# Patient Record
Sex: Female | Born: 1985 | Race: White | Hispanic: No | Marital: Married | Smoking: Former smoker
Health system: Southern US, Community
[De-identification: ages and names within clinical notes are randomized; demographics above are authoritative.]

## PROBLEM LIST (undated history)

## (undated) DIAGNOSIS — Z789 Other specified health status: Secondary | ICD-10-CM

## (undated) HISTORY — DX: Other specified health status: Z78.9

## (undated) HISTORY — PX: PILONIDAL CYST EXCISION: SHX744

---
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
## 2004-05-15 ENCOUNTER — Ambulatory Visit: Payer: Self-pay | Admitting: Family Medicine

## 2004-10-03 ENCOUNTER — Ambulatory Visit: Payer: Self-pay | Admitting: Family Medicine

## 2004-10-03 ENCOUNTER — Other Ambulatory Visit: Admission: RE | Admit: 2004-10-03 | Discharge: 2004-10-03 | Payer: Self-pay | Admitting: Family Medicine

## 2004-12-23 ENCOUNTER — Ambulatory Visit: Payer: Self-pay | Admitting: Family Medicine

## 2005-01-15 ENCOUNTER — Ambulatory Visit: Payer: Self-pay | Admitting: Family Medicine

## 2005-12-21 ENCOUNTER — Ambulatory Visit: Payer: Self-pay | Admitting: Family Medicine

## 2005-12-21 ENCOUNTER — Other Ambulatory Visit: Admission: RE | Admit: 2005-12-21 | Discharge: 2005-12-21 | Payer: Self-pay | Admitting: Family Medicine

## 2005-12-21 ENCOUNTER — Encounter: Payer: Self-pay | Admitting: Family Medicine

## 2006-03-19 ENCOUNTER — Ambulatory Visit: Payer: Self-pay | Admitting: Family Medicine

## 2006-03-26 ENCOUNTER — Ambulatory Visit: Payer: Self-pay | Admitting: Family Medicine

## 2006-06-28 ENCOUNTER — Ambulatory Visit: Payer: Self-pay | Admitting: Family Medicine

## 2006-06-29 ENCOUNTER — Encounter: Payer: Self-pay | Admitting: Family Medicine

## 2006-08-02 ENCOUNTER — Encounter (INDEPENDENT_AMBULATORY_CARE_PROVIDER_SITE_OTHER): Payer: Self-pay | Admitting: Specialist

## 2006-08-02 ENCOUNTER — Ambulatory Visit (HOSPITAL_BASED_OUTPATIENT_CLINIC_OR_DEPARTMENT_OTHER): Admission: RE | Admit: 2006-08-02 | Discharge: 2006-08-02 | Payer: Self-pay | Admitting: Obstetrics and Gynecology

## 2006-11-10 ENCOUNTER — Encounter: Payer: Self-pay | Admitting: Family Medicine

## 2006-11-10 DIAGNOSIS — J309 Allergic rhinitis, unspecified: Secondary | ICD-10-CM | POA: Insufficient documentation

## 2006-11-10 DIAGNOSIS — B009 Herpesviral infection, unspecified: Secondary | ICD-10-CM | POA: Insufficient documentation

## 2006-11-10 DIAGNOSIS — B977 Papillomavirus as the cause of diseases classified elsewhere: Secondary | ICD-10-CM | POA: Insufficient documentation

## 2006-11-22 ENCOUNTER — Encounter: Payer: Self-pay | Admitting: Family Medicine

## 2006-11-22 ENCOUNTER — Other Ambulatory Visit: Admission: RE | Admit: 2006-11-22 | Discharge: 2006-11-22 | Payer: Self-pay | Admitting: Family Medicine

## 2006-11-22 ENCOUNTER — Ambulatory Visit: Payer: Self-pay | Admitting: Family Medicine

## 2006-11-24 ENCOUNTER — Encounter (INDEPENDENT_AMBULATORY_CARE_PROVIDER_SITE_OTHER): Payer: Self-pay | Admitting: *Deleted

## 2006-11-25 ENCOUNTER — Telehealth (INDEPENDENT_AMBULATORY_CARE_PROVIDER_SITE_OTHER): Payer: Self-pay | Admitting: *Deleted

## 2007-11-08 ENCOUNTER — Ambulatory Visit: Payer: Self-pay | Admitting: Family Medicine

## 2007-12-05 ENCOUNTER — Encounter: Payer: Self-pay | Admitting: Family Medicine

## 2007-12-05 ENCOUNTER — Ambulatory Visit: Payer: Self-pay | Admitting: Family Medicine

## 2007-12-05 ENCOUNTER — Other Ambulatory Visit: Admission: RE | Admit: 2007-12-05 | Discharge: 2007-12-05 | Payer: Self-pay | Admitting: Family Medicine

## 2008-04-18 ENCOUNTER — Ambulatory Visit: Payer: Self-pay | Admitting: Family Medicine

## 2008-12-10 ENCOUNTER — Ambulatory Visit: Payer: Self-pay | Admitting: Family Medicine

## 2008-12-10 ENCOUNTER — Encounter: Payer: Self-pay | Admitting: Family Medicine

## 2008-12-10 ENCOUNTER — Other Ambulatory Visit: Admission: RE | Admit: 2008-12-10 | Discharge: 2008-12-10 | Payer: Self-pay | Admitting: Family Medicine

## 2008-12-17 ENCOUNTER — Encounter (INDEPENDENT_AMBULATORY_CARE_PROVIDER_SITE_OTHER): Payer: Self-pay | Admitting: *Deleted

## 2009-06-10 ENCOUNTER — Telehealth: Payer: Self-pay | Admitting: Family Medicine

## 2009-06-12 ENCOUNTER — Ambulatory Visit: Payer: Self-pay | Admitting: Family Medicine

## 2009-06-14 LAB — CONVERTED CEMR LAB
HCV Ab: NEGATIVE
Hep A IgM: NEGATIVE

## 2009-09-12 ENCOUNTER — Ambulatory Visit: Payer: Self-pay | Admitting: Family Medicine

## 2009-09-12 DIAGNOSIS — F39 Unspecified mood [affective] disorder: Secondary | ICD-10-CM | POA: Insufficient documentation

## 2009-11-12 ENCOUNTER — Ambulatory Visit: Payer: Self-pay | Admitting: Family Medicine

## 2009-12-09 ENCOUNTER — Telehealth: Payer: Self-pay | Admitting: Family Medicine

## 2009-12-16 ENCOUNTER — Ambulatory Visit: Payer: Self-pay | Admitting: Family Medicine

## 2009-12-16 ENCOUNTER — Other Ambulatory Visit: Admission: RE | Admit: 2009-12-16 | Discharge: 2009-12-16 | Payer: Self-pay | Admitting: Family Medicine

## 2009-12-16 LAB — CONVERTED CEMR LAB: Pap Smear: NORMAL

## 2009-12-17 LAB — CONVERTED CEMR LAB
Basophils Relative: 0.3 % (ref 0.0–3.0)
Cholesterol: 139 mg/dL (ref 0–200)
Eosinophils Absolute: 0.1 10*3/uL (ref 0.0–0.7)
Eosinophils Relative: 1.3 % (ref 0.0–5.0)
Hemoglobin: 12.8 g/dL (ref 12.0–15.0)
MCHC: 34.4 g/dL (ref 30.0–36.0)
MCV: 89.4 fL (ref 78.0–100.0)
Monocytes Absolute: 0.6 10*3/uL (ref 0.1–1.0)
Neutro Abs: 3.5 10*3/uL (ref 1.4–7.7)
RBC: 4.16 M/uL (ref 3.87–5.11)
Triglycerides: 106 mg/dL (ref 0.0–149.0)
WBC: 5.6 10*3/uL (ref 4.5–10.5)

## 2009-12-18 LAB — CONVERTED CEMR LAB: Hep A IgM: NEGATIVE

## 2009-12-20 ENCOUNTER — Encounter: Payer: Self-pay | Admitting: Family Medicine

## 2009-12-20 LAB — CONVERTED CEMR LAB: Pap Smear: NEGATIVE

## 2010-03-18 ENCOUNTER — Ambulatory Visit: Payer: Self-pay | Admitting: Family Medicine

## 2010-06-17 NOTE — Progress Notes (Signed)
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 She says she and her co-worker cleaned the wound and her dentist employer told her to call her primary doctor to find out what she needs to do.  I told her every medical and dentist office should have their protocol on what to do- that normally she and the patient would need to be tested and she will need follow up testing.  I told her to speak with the dentist about this, but that it should be written somewhere in that office. Initial call taken by: Lowella Petties CMA,  June 10, 2009 10:07 AM  Follow-up for Phone Call        she should follow the protocol that her office has set -- she can schedule appt when she finds out what she needs in terms of lab work  Follow-up by: Judith Part MD,  June 10, 2009 10:28 AM

## 2010-06-17 NOTE — Assessment & Plan Note (Signed)
Summary: got stuck with used needle at work needs to come in for blood...   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  accidentally got stuck with a needle  pt was not high risk for disease   no particular protocol per her office for testing   feels fine- but very anxious about what happened   no fever/abd pain/ fatigue or malaise    Allergies: No Known Drug Allergies  Past History:  Past Medical History: Last updated: 12/05/2007 Allergic rhinitis HPV past   Past Surgical History: Last updated: 11/10/2006 Pilonidal cyst- excised 07/2006  Family History: Last updated: 11/22/2006 P GM with breast cancer  Social History: Last updated: 12/05/2007 works at a Education officer, 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 Eyes:  Denies eye irritation. CV:  Denies chest pain or discomfort and palpitations. Resp:  Denies cough and wheezing. GI:  Denies abdominal pain, nausea, and vomiting. MS:  Denies joint pain. Derm:  Denies itching, poor wound healing, and rash. Neuro:  Denies headaches. Psych:  Complains of anxiety. Heme:  Denies abnormal bruising and bleeding.  Physical Exam  General:  Well-developed,well-nourished,in no acute distress; alert,appropriate and cooperative throughout examination Head:  normocephalic, atraumatic, and no abnormalities observed.   Mouth:  pharynx pink and moist.   Neck:  No deformities, masses, or tenderness noted. Heart:   Normal rate and regular rhythm. S1 and S2 normal without gallop, murmur, click, rub or other extra sounds. Abdomen:  soft and non-tender.   Msk:  no acute joint changes  Skin:  no rash  healed tiny puncture wound consistent with needle stick on dorsal R hand -- no redness or swelling Psych:  pt is distressed and tearful today- but easily consolible    Impression & Recommendations:  Problem # 1:  ACCIDENT CAUSED BY HYPODERMIC NEEDLE (ICD-E920.5) Assessment New accidental needle stick by used needle (low risk pt ) in dental office  labs today incl HIV and hepatitis acute panel  update if any symptoms -- keep wound clean repeat lab in 6 mo if neg  adv that dental office needs to develop protocol for these type of instances  Orders: Venipuncture (11914) T-HIV Antibody  (Reflex) ([id number]) T-Hepatitis Acute Panel ([id number])  Problem # 2:  COMMUNICABLE DISEASE, EXPOSURE TO (ICD-V01.9) Assessment: New see above with needle stick  Orders: Venipuncture (95284) T-HIV Antibody  (Reflex) ([id number]) T-Hepatitis Acute Panel (25366-44034) Specimen Handling (74259)  Complete Medication List: 1)  Ortho Tri-cyclen (28) 0.035 Mg Tabs (Norgestimate-ethinyl estradiol) .... Take by mouth as directed qd  Patient Instructions: 1)  keep wound clean and dry -- if redness or swelling or fever (or other symptoms ) please let me know 2)  labs today 3)  sched lab appt 6 mo for hepatitis panel and HIV -- for 920.5 and V01.9  Prior Medications (reviewed today): ORTHO TRI-CYCLEN (28) 0.035 MG  TABS (NORGESTIMATE-ETHINYL ESTRADIOL) take by mouth as directed qd Current Allergies: No known allergies

## 2010-06-17 NOTE — Assessment & Plan Note (Signed)
Summary: 2ND GARDISIL SHOT / LFW   Nurse Visit   Allergies: No Known Drug Allergies  Immunizations Administered:  HPV # 2:    Vaccine Type: Gardasil    Site: right deltoid    Mfr: Merck    Dose: 0.5 ml    Route: IM    Given by: Delilah Shan CMA (AAMA)    Exp. Date: 04/09/2011    Lot #: [id number]    VIS given: 06/19/05 version given November 12, 2009.  Orders Added: 1)  HPV Vaccine - 3 sched doses - IM [90649] 2)  Admin 1st Vaccine [60454]

## 2010-06-17 NOTE — Assessment & Plan Note (Signed)
Summary: GARDASIL#3/TOWER/CLE   Nurse Visit   Allergies: No Known Drug Allergies  Immunizations Administered:  HPV # 3:    Vaccine Type: Gardasil    Site: left deltoid    Mfr: Merck    Dose: 0.5 ml    Route: IM    Given by: Lewanda Rife LPN    Exp. Date: 03/28/2012    Lot #: [id number]    VIS given: 09/17/09 version given March 18, 2010.  Orders Added: 1)  HPV Vaccine - 3 sched doses - IM [90649] 2)  Admin 1st Vaccine [24268]

## 2010-06-17 NOTE — Assessment & Plan Note (Signed)
Summary: TALK ABOUT CHANGING BIRTH CONTROL & GARDISIL SHOT / LFW   Vital Signs:  Patient profile:   25 year old female Height:      66 inches Weight:      149.50 pounds BMI:     24.22 Temp:     97.7 degrees F oral Pulse rate:   84 / minute Pulse rhythm:   regular BP sitting:   110 / 70  (left arm) Cuff size:   regular  Vitals Entered By: Lewanda Rife LPN (September 12, 2009 10:32 AM) CC: Wants to discuss changing birth control pill and to discuss Gardisil   History of Present Illness: is currently try ortho tri cyclen  is changing as she gets older  stays more tired and some anxiety --- every few mo - worse around menses  her libido is less in general   memory seems to be problematic when she is pre- occupied   looked online -- and rev symptoms of depression- does not have symptoms of depression   work is stressful -- had the incident with needle stick that really scared her  wants to look for another job   periods are just fine -- regular , not too long or painful or heavy   is interested in starting gardasil vaccine series as well  has been with one boyfriend for 2 years -- does use condoms    Allergies (verified): No Known Drug Allergies  Past History:  Past Surgical History: Last updated: 11/10/2006 Pilonidal cyst- excised 07/2006  Family History: Last updated: 11/22/2006 P GM with breast cancer  Social History: Last updated: 12/05/2007 works at a Education officer, 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 Eyes:  Denies blurring and eye pain. CV:  Denies chest pain or discomfort, lightheadness, and palpitations. Resp:  Denies cough and wheezing. GI:  Denies abdominal pain and indigestion. GU:  Denies abnormal vaginal  bleeding, discharge, dysuria, and urinary frequency. MS:  Denies joint pain. Derm:  Denies lesion(s), poor wound healing, and rash. Neuro:  Denies headaches, numbness, and tingling. Psych:  Complains of anxiety and irritability; denies depression, panic attacks, sense of great danger, and suicidal thoughts/plans. Endo:  Denies cold intolerance, excessive thirst, excessive urination, and polyuria. Heme:  Denies abnormal bruising and bleeding.  Physical Exam  General:  Well-developed,well-nourished,in no acute distress; alert,appropriate and cooperative throughout examination Head:  normocephalic, atraumatic, and no abnormalities observed.   Eyes:  vision grossly intact, pupils equal, pupils round, and pupils reactive to light.  no conjunctival pallor, injection or icterus  Mouth:  pharynx pink and moist.   Neck:  supple with full rom and no masses or thyromegally, no JVD or carotid bruit  Chest Wall:  No deformities, masses, or tenderness noted. Lungs:  Normal respiratory effort, chest expands symmetrically. Lungs are clear to auscultation, no crackles or wheezes. Heart:  Normal rate and regular rhythm. S1 and S2 normal without gallop, murmur, click, rub or other extra sounds. Abdomen:  Bowel sounds positive,abdomen soft and non-tender without masses, organomegaly or hernias noted. no suprapubic tenderness or fullness felt  Msk:  No deformity or scoliosis noted of thoracic or lumbar spine.   Extremities:  No clubbing, cyanosis, edema, or deformity noted with normal full range of motion of all joints.  Neurologic:  cranial nerves II-XII intact, sensation intact to light touch, gait normal, and DTRs symmetrical and normal.   Skin:  Intact without suspicious lesions or rashes Cervical Nodes:  No lymphadenopathy noted Inguinal Nodes:  No significant adenopathy Psych:  tearful at times when disc mood changes  no SI  talks candidly about stress with good insignt   Impression &  Recommendations:  Problem # 1:  MOOD SWINGS (ICD-296.99) Assessment New pt has tracked this and thinks may be due to her OC - also dec libido  does not think she has depression will change to monophasic OC - generic yasmin -- and update  if not imp -- adv to f/u and disc further pt is non smoker/ no hx of blood clots  will continue using condoms for std prev  Problem # 2:  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 Take by mouth as directed once daily  generic please  Other Orders: HPV Vaccine - 3 sched doses - IM (60454) Admin 1st Vaccine (09811)  Patient Instructions: 1)  gardasil first shot today  2)  change pills to generic yasmin -- I sent px to pharmacy -- start on sunday 3)  update me in 1-2 months (follow up ) if not feeling better  Prescriptions: YASMIN 28 3-0.03 MG TABS (DROSPIRENONE-ETHINYL ESTRADIOL) take by mouth as directed once daily  generic please  #1 pack x 5   Entered and Authorized by:   Marne Ann Tower MD   Signed by:   Marne Ann Tower MD on 09/12/2009   Method used:   [facility]  [address] [id number]       [address]       [place], [place]  [address]       Ph: [phone] or [phone]       Fax: [phone]   RxID:   [id number]   Current Allergies (reviewed today): No known allergies    Immunizations Administered:  HPV # 1:    Vaccine Type: Gardasil    Site: left deltoid    Mfr: Merck    Dose: 0.5 ml    Route: IM    Given by: Lewanda Rife LPN    Exp. Date: 04/09/2011    Lot #: [id number]    VIS given: 06/19/05 version given September 12, 2009.

## 2010-06-17 NOTE — Progress Notes (Signed)
Summary: refill on orhto try cyclen   Phone Note Call from Patient Call back at Home Phone [phone]   Caller: Patient Call For: Rita Salas Summary of Call: Patient is asking for a refill on orhto tri cyclen. We have yaz on her med list but she says that she never started it, she continued taking the ortho. She has an appt on 12-16-09. [facility] [address]  Initial call taken by: Melody Comas,  December 09, 2009 11:49 AM  Follow-up for Phone Call        px written on EMR for call in  Follow-up by: Rita Salas,  December 09, 2009 1:29 PM    New/Updated Medications: ORTHO TRI-CYCLEN (28) 0.18/0.215/0.25 MG-35 MCG TABS (NORGESTIM-ETH ESTRAD TRIPHASIC) 1 by mouth once daily as directed Prescriptions: ORTHO TRI-CYCLEN (28) 0.18/0.215/0.25 MG-35 MCG TABS (NORGESTIM-ETH ESTRAD TRIPHASIC) 1 by mouth once daily as directed  #1 pack x 1   Entered by:   Melody Comas   Authorized by:   Rita Salas   Signed by:   Melody Comas on 12/09/2009   Method used:   [facility] to        [facility] [phone][facility] (retail)       [address] [place], [place]  [address]       Ph: [phone] or [phone]       Fax: [phone]   RxID:   [id number] ORTHO TRI-CYCLEN (28) 0.18/0.215/0.25 MG-35 MCG TABS (NORGESTIM-ETH ESTRAD TRIPHASIC) 1 by mouth once daily as directed  #1 pack x 1   Entered and Authorized by:   Rita Salas   Signed by:   Melody Comas on 12/09/2009   Method used:   Telephoned to ...       [facility] [phone]* (retail)       [address] [place], [place]  [address]       Ph: [phone] or [phone]       Fax: [phone]   RxID:   [id number]

## 2010-06-17 NOTE — Letter (Signed)
Summary: Results Follow up Letter  [facility]  [address] [place], [place] [address]   Phone: [phone]  Fax: [phone]    12/20/2009 MRN: [id number]  Rita Salas [address] [place], [place]  [address]  Dear Ms. [name],  The following are the results of your recent test(s):  Test         Result    Pap Smear:        Normal __X___  Not Normal _____ Comments: ______________________________________________________ Cholesterol: LDL(Bad cholesterol):         Your goal is less than:         HDL (Good cholesterol):       Your goal is more than: Comments:  ______________________________________________________ Mammogram:        Normal _____  Not Normal _____ Comments:  ___________________________________________________________________ Hemoccult:        Normal _____  Not normal _______ Comments:    _____________________________________________________________________ Other Tests:    We routinely do not discuss normal results over the telephone.  If you desire a copy of the results, or you have any questions about this information we can discuss them at your next office visit.   Sincerely,      Roxy Manns, MD

## 2010-06-17 NOTE — Assessment & Plan Note (Signed)
Summary: PAP SMEAR AND CPX/CLE   Vital Signs:  Patient profile:   25 year old female Height:      66 inches Weight:      148.50 pounds BMI:     24.06 Temp:     97.9 degrees F oral Pulse rate:   84 / minute Pulse rhythm:   regular BP sitting:   110 / 76  (left arm) Cuff size:   regular  Vitals Entered By: Lewanda Rife LPN (December 16, 2009 10:37 AM) CC: CPX with pap and breast exam LMP 12/03/09   History of Present Illness: here for wellness exam and gyn care   has been ok overall   wt is down 1 lb  bp good 110/76  hx of hpv in past last pap nl -- is due for that  had 2 of 3 hpv shots so far-- have gone ok   for hepatitis and hiv tests today 6 mo after needle stick  Tdap 09  OC--ended up going back to ortho tri cyclen -- too expensive with yaz and also worried about side eff  periods are fine - regular and not too heavy or painful   is engaged to be married -- a year from sept -- is starting to plan      Allergies (verified): No Known Drug Allergies  Past History:  Past Medical History: Last updated: 09/12/2009 Allergic rhinitis HPV past  anxiety with mood swings   Past Surgical History: Last updated: 11/10/2006 Pilonidal cyst- excised 07/2006  Family History: Last updated: 11/22/2006 P GM with breast cancer  Social History: Last updated: 12/05/2007 works at a Education officer, 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 Eyes:  Denies blurring and eye irritation. CV:  Denies chest pain or discomfort and lightheadness. Resp:  Denies cough, shortness of breath, and wheezing. GI:  Denies abdominal pain, change in bowel habits, and indigestion. GU:  Denies abnormal vaginal bleeding, discharge, dysuria, and urinary frequency. MS:  Denies joint pain, joint redness, and joint swelling. Derm:  Denies itching, lesion(s),  poor wound healing, and rash. Neuro:  Denies numbness and tingling. Psych:  Denies anxiety and depression. Endo:  Denies cold intolerance, excessive thirst, excessive urination, and heat intolerance. Heme:  Denies abnormal bruising and bleeding.  Physical Exam  General:  Well-developed,well-nourished,in no acute distress; alert,appropriate and cooperative throughout examination Head:  normocephalic, atraumatic, and no abnormalities observed.   Eyes:  vision grossly intact, pupils equal, pupils round, and pupils reactive to light.  no conjunctival pallor, injection or icterus  Ears:  R ear normal and L ear normal.   Nose:  no nasal discharge.   Mouth:  pharynx pink and moist.   Neck:  supple with full rom and no masses or thyromegally, no JVD or carotid bruit  Chest Wall:  No deformities, masses, or tenderness noted. Breasts:  No mass, nodules, thickening, tenderness, bulging, retraction, inflamation, nipple discharge or skin changes noted.   Lungs:  Normal respiratory effort, chest expands symmetrically. Lungs are clear to auscultation, no crackles or wheezes. Heart:  Normal rate and regular rhythm. S1 and S2 normal without gallop, murmur, click, rub or other extra sounds. Abdomen:  Bowel sounds positive,abdomen soft and non-tender without masses, organomegaly or hernias noted. Genitalia:  Normal introitus for age, no external lesions, no vaginal discharge, mucosa pink and moist, no vaginal or cervical lesions,  no vaginal atrophy, no friaility or hemorrhage, normal uterus size and position, no adnexal masses or tenderness mucous noted at OS Msk:  No deformity or scoliosis noted of thoracic or lumbar spine.   Pulses:  R and L carotid,radial,femoral,dorsalis pedis and posterior tibial pulses are full and equal bilaterally Extremities:  No clubbing, cyanosis, edema, or deformity noted with normal full range of motion of all joints.   Neurologic:  sensation intact to light touch, gait normal,  and DTRs symmetrical and normal.   Skin:  Intact without suspicious lesions or rashes Cervical Nodes:  No lymphadenopathy noted Axillary Nodes:  No palpable lymphadenopathy Inguinal Nodes:  No significant adenopathy Psych:  normal affect, talkative and pleasant    Impression & Recommendations:  Problem # 1:  HEALTH MAINTENANCE EXAM (ICD-V70.0) Assessment Comment Only  reviewed health habits including diet, exercise and skin cancer prevention reviewed health maintenance list and family history  some wellness labs and lipiod screen today  Orders: Venipuncture ([address]) TLB-Lipid Panel (80061-LIPID) TLB-Glucose, QUANT (82947-GLU) TLB-CBC Platelet - w/Differential (85025-CBCD)  Problem # 2:  GYNECOLOGICAL EXAMINATION, ROUTINE (ICD-V72.31) Assessment: Comment Only annual exam with pap remote hx of hpv  has had first 2 hpv shots   Problem # 3:  ACCIDENT CAUSED BY HYPODERMIC NEEDLE (ICD-E920.5) Assessment: Comment Only with poss exp to comm disease 6 mo f/u hepatitis and hiv tests   Complete Medication List: 1)  Ortho Tri-cyclen (28) 0.18/0.215/0.25 Mg-35 Mcg Tabs (Norgestim-eth estrad triphasic) .Marland Kitchen.. 1 by mouth once daily as directed  Patient Instructions: 1)  make sure that your 3rd hpv shot is scheduled  2)  eat healthy diet and get regular exercise 3)  wear your sunscreen 4)  I sent your med to pharmacy  5)  labs today  Prescriptions: ORTHO TRI-CYCLEN (28) 0.18/0.215/0.25 MG-35 MCG TABS (NORGESTIM-ETH ESTRAD TRIPHASIC) 1 by mouth once daily as directed  #1 pack x 11   Entered and Authorized by:   Judith Part MD   Signed by:   Judith Part MD on 12/16/2009   Method used:   [facility] to        [facility] [phone]* (retail)       [address] [place], [place]  [address]       Ph: [phone] or [phone]       Fax: [phone]   RxID:   [id number]   Current Allergies (reviewed today): No known allergies

## 2010-10-03 NOTE — Op Note (Signed)
NAME:  Rita Salas, Rita Salas             ACCOUNT NO.:  [id number]   MEDICAL RECORD NO.:  [id number]          PATIENT TYPE:  AMB   LOCATION:  [facility]                          FACILITY:  [facility]   PHYSICIAN:  Sandria Bales. Ezzard Standing, M.D.  DATE OF BIRTH:  1986-02-04   DATE OF PROCEDURE:  08/02/2006  DATE OF DISCHARGE:                               OPERATIVE REPORT   PREOPERATIVE DIAGNOSIS:  Recurrent infections and pilonidal cyst.   POSTOPERATIVE DIAGNOSIS:  Recurrent infections and pilonidal cyst.   PROCEDURE:  Excision of pilonidal cyst.   SURGEON:  Sandria Bales. Ezzard Standing, M.D.   FIRST ASSISTANT:  None.   ANESTHESIA:  General endotracheal with approximately 30 mL of 0.25%  Marcaine.   COMPLICATIONS:  None.   REASON FOR PROCEDURE:  Ms. [name] is a 25 year old white female who has  had at least 2 bouts with pilonidal cyst infections.  This is now  healed, but she comes for attempted wide excision of this pilonidal  cyst.   The indications and potential complications were explained to the  patient.  Potential complications include but are not limited to  bleeding, infections, recurrence of the pilonidal cyst.   OPERATIVE NOTE:  The patient underwent general endotracheal anesthetic.  A timeout was performed confirming patient and the planned procedure.  Dr. Autumn Patty supervised anesthesia.  The patient was placed in  the prone position after general endotracheal anesthetic.   Her buttocks were taped apart.  The buttocks was painted with Betadine  solution and sterilely draped.  The patient had a scar to the left of  her intergluteal cleft and she had a scar towards the middle of her  intergluteal cleft.  I enclosed the midline scar with my excision.  I  left the other scare outside, but excised this area the way down to her  coccyx.   There was on evidence of any tract forming to the left.  I infiltrated  with about 30 mL of 0.25% Marcaine.  I controlled bleeding with  electrocautery.  I  then packed the wound open with Betadine gauze and  covered this with 4 x 4's and sterilely dressed it.   The patient tolerated the procedure well.  She will be discharged home  today.  Start dressing changes tomorrow and see me back in approximately  1 week in the office.      Sandria Bales. Ezzard Standing, M.D.  Electronically Signed     DHN/MEDQ  D:  08/02/2006  T:  08/02/2006  Job:  [id number]   cc:   Marne A. Milinda Antis, MD

## 2011-11-16 ENCOUNTER — Encounter: Payer: Self-pay | Admitting: Family Medicine

## 2011-11-16 ENCOUNTER — Other Ambulatory Visit (HOSPITAL_COMMUNITY)
Admission: RE | Admit: 2011-11-16 | Discharge: 2011-11-16 | Disposition: A | Discharge status: Home / Self Care | Payer: BC Managed Care – PPO | Source: Ambulatory Visit | Attending: Family Medicine | Admitting: Family Medicine

## 2011-11-16 ENCOUNTER — Ambulatory Visit (INDEPENDENT_AMBULATORY_CARE_PROVIDER_SITE_OTHER): Payer: BC Managed Care – PPO | Admitting: Family Medicine

## 2011-11-16 VITALS — BP 134/83 | HR 86 | Temp 99.1°F | Ht 66.0 in | Wt 162.8 lb

## 2011-11-16 DIAGNOSIS — Z0000 Encounter for general adult medical examination without abnormal findings: Secondary | ICD-10-CM

## 2011-11-16 DIAGNOSIS — Z01419 Encounter for gynecological examination (general) (routine) without abnormal findings: Secondary | ICD-10-CM

## 2011-11-16 NOTE — Assessment & Plan Note (Signed)
Annual exam with pap  Last pap nl  Remote hx HPV Had some spotting today as well from menses  May try to conceive in future- adv to take PNV

## 2011-11-16 NOTE — Patient Instructions (Addendum)
Labs today Keep up good health habits If you decide to try for pregnancy- get a generic pre natal vitamin otc and take daily

## 2011-11-16 NOTE — Assessment & Plan Note (Signed)
Reviewed health habits including diet and exercise and skin cancer prevention Also reviewed health mt list, fam hx and immunizations  Wellness labs today 

## 2011-11-16 NOTE — Progress Notes (Signed)
Subjective:    Patient ID: Rita Salas, female    DOB: May 26, 1985, 26 y.o.   MRN: [id number]  HPI Here for health maintenance exam and to review chronic medical problems  And gyn care  Is doing ok  Working a lot  Nothing new health wise  Planning wedding / buying a house was stressful   bp 134/83 Wt is up 14 lb with bmi of 26  Due for gyn/ pap exam Last pap nl in 2011 Hx of HPV No gyn problems lately  No contraception  Still using condoms  Not a smoker   Flu shot --did not get one this year   Is eating a healthy diet and getting good exercise   Wants to go ahead and get labs today  Patient Active Problem List  Diagnosis  . HSV  . HPV  . MOOD SWINGS  . ALLERGIC RHINITIS  . Routine gynecological examination  . Routine general medical examination at a health care facility   No past medical history on file. No past surgical history on file. History  Substance Use Topics  . Smoking status: Former Games developer  . Smokeless tobacco: Not on file  . Alcohol Use: Yes     occassionally   No family history on file. No Known Allergies No current outpatient prescriptions on file prior to visit.     Patient Active Problem List  Diagnosis  . HSV  . HPV  . MOOD SWINGS  . ALLERGIC RHINITIS  . Routine gynecological examination  . Routine general medical examination at a health care facility   No past medical history on file. No past surgical history on file. History  Substance Use Topics  . Smoking status: Former Games developer  . Smokeless tobacco: Not on file  . Alcohol Use: Yes     occassionally   No family history on file. No Known Allergies No current outpatient prescriptions on file prior to visit.      Review of Systems Review of Systems  Constitutional: Negative for fever, appetite change, fatigue and unexpected weight change.  Eyes: Negative for pain and visual disturbance.  Respiratory: Negative for cough and shortness of breath.   Cardiovascular:  Negative for cp or palpitations    Gastrointestinal: Negative for nausea, diarrhea and constipation.  Genitourinary: Negative for urgency and frequency.  Skin: Negative for pallor or rash   Neurological: Negative for weakness, light-headedness, numbness and headaches.  Hematological: Negative for adenopathy. Does not bruise/bleed easily.  Psychiatric/Behavioral: Negative for dysphoric mood. The patient is not nervous/anxious.         Objective:   Physical Exam  Constitutional: She appears well-developed and well-nourished. No distress.  HENT:  Head: Normocephalic and atraumatic.  Right Ear: External ear normal.  Left Ear: External ear normal.  Nose: Nose normal.  Mouth/Throat: Oropharynx is clear and moist.  Eyes: Conjunctivae and EOM are normal. Pupils are equal, round, and reactive to light. No scleral icterus.  Neck: Normal range of motion. Neck supple. No JVD present. Carotid bruit is not present. No thyromegaly present.  Cardiovascular: Normal rate, regular rhythm, normal heart sounds and intact distal pulses.  Exam reveals no gallop.   Pulmonary/Chest: Effort normal and breath sounds normal. No respiratory distress. She has no wheezes.  Abdominal: Soft. Bowel sounds are normal. She exhibits no distension, no abdominal bruit and no mass. There is no tenderness.  Genitourinary: Vagina normal and uterus normal. No breast swelling, tenderness, discharge or bleeding. There is no lesion on the right  labia. There is no lesion on the left labia. Uterus is not enlarged and not tender. Cervix exhibits no motion tenderness, no discharge and no friability. Right adnexum displays no mass, no tenderness and no fullness. Left adnexum displays no mass and no tenderness. No vaginal discharge found.       Breast exam: No mass, nodules, thickening, tenderness, bulging, retraction, inflamation, nipple discharge or skin changes noted.  No axillary or clavicular LA.  Chaperoned exam.   Cervix is posterior   Musculoskeletal: Normal range of motion. She exhibits no edema and no tenderness.  Lymphadenopathy:    She has no cervical adenopathy.  Neurological: She is alert. She has normal reflexes. No cranial nerve deficit. She exhibits normal muscle tone. Coordination normal.  Skin: Skin is warm and dry. No rash noted.  Psychiatric: She has a normal mood and affect.          Assessment & Plan:

## 2011-11-17 LAB — CBC WITH DIFFERENTIAL/PLATELET
Basophils Relative: 0.3 % (ref 0.0–3.0)
Eosinophils Absolute: 0.1 10*3/uL (ref 0.0–0.7)
Eosinophils Relative: 1.2 % (ref 0.0–5.0)
HCT: 38.3 % (ref 36.0–46.0)
Lymphs Abs: 1.5 10*3/uL (ref 0.7–4.0)
MCHC: 33.2 g/dL (ref 30.0–36.0)
MCV: 90.3 fl (ref 78.0–100.0)
Monocytes Absolute: 0.5 10*3/uL (ref 0.1–1.0)
Neutro Abs: 4.5 10*3/uL (ref 1.4–7.7)
Neutrophils Relative %: 68.1 % (ref 43.0–77.0)
RBC: 4.24 Mil/uL (ref 3.87–5.11)

## 2011-11-17 LAB — LIPID PANEL
Cholesterol: 132 mg/dL (ref 0–200)
HDL: 47.5 mg/dL (ref >39.00)
LDL Cholesterol: 71 mg/dL (ref 0–99)
VLDL: 13.4 mg/dL (ref 0.0–40.0)

## 2011-11-17 LAB — COMPREHENSIVE METABOLIC PANEL
AST: 18 U/L (ref 0–37)
Alkaline Phosphatase: 81 U/L (ref 39–117)
BUN: 10 mg/dL (ref 6–23)
Creatinine, Ser: 0.7 mg/dL (ref 0.4–1.2)
Glucose, Bld: 100 mg/dL — ABNORMAL HIGH (ref 70–99)
Potassium: 3.9 mEq/L (ref 3.5–5.1)
Total Bilirubin: 0.6 mg/dL (ref 0.3–1.2)

## 2011-11-17 LAB — TSH: TSH: 1.73 u[IU]/mL (ref 0.35–5.50)

## 2013-02-14 ENCOUNTER — Encounter: Payer: Self-pay | Admitting: Family Medicine

## 2013-02-14 ENCOUNTER — Other Ambulatory Visit (HOSPITAL_COMMUNITY)
Admission: RE | Admit: 2013-02-14 | Discharge: 2013-02-14 | Disposition: A | Discharge status: Home / Self Care | Payer: BC Managed Care – PPO | Source: Ambulatory Visit | Attending: Family Medicine | Admitting: Family Medicine

## 2013-02-14 ENCOUNTER — Ambulatory Visit (INDEPENDENT_AMBULATORY_CARE_PROVIDER_SITE_OTHER): Payer: BC Managed Care – PPO | Admitting: Family Medicine

## 2013-02-14 VITALS — BP 98/62 | HR 75 | Temp 98.5°F | Ht 66.0 in | Wt 168.5 lb

## 2013-02-14 DIAGNOSIS — Z23 Encounter for immunization: Secondary | ICD-10-CM

## 2013-02-14 DIAGNOSIS — Z01419 Encounter for gynecological examination (general) (routine) without abnormal findings: Secondary | ICD-10-CM | POA: Insufficient documentation

## 2013-02-14 DIAGNOSIS — Z0000 Encounter for general adult medical examination without abnormal findings: Secondary | ICD-10-CM

## 2013-02-14 NOTE — Progress Notes (Signed)
Subjective:    Patient ID: Rita Salas, female    DOB: 08/15/1985, 27 y.o.   MRN: [id number]  HPI Here for health maintenance exam and to review chronic medical problems    Doing well  Working a lot   Flu vaccine - she has never had a flu shot -- will get one this year  Td 7/09 Pap 7/13 - and needs one day  HPV in the past  peroids are fine -not too long or painful or heavy as a rule    (last spring had a few peroids with cramping)  Wt is up 8 lb   Not on OC  Does not need birth control - is married and wants pregnancy to happen naturally  Not on a pnv   Lab work panel in 2013 that was normal Lab Results  Component Value Date   CHOL 132 11/16/2011   HDL 47.50 11/16/2011   LDLCALC 71 11/16/2011   TRIG 67.0 11/16/2011   CHOLHDL 3 11/16/2011      Patient Active Problem List   Diagnosis Date Noted  . Routine gynecological examination 11/16/2011  . Routine general medical examination at a health care facility 11/16/2011  . MOOD SWINGS 09/12/2009  . 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 ALLERGIC RHINITIS 11/10/2006   No past medical history on file. No past surgical history on file. History  Substance Use Topics  . Smoking status: Former Games developer  . Smokeless tobacco: Not on file  . Alcohol Use: Yes     Comment: occassionally   No family history on file. No Known Allergies No current outpatient prescriptions on file prior to visit.   No current facility-administered medications on file prior to visit.    Review of Systems Review of Systems  Constitutional: Negative for fever, appetite change, fatigue and unexpected weight change.  Eyes: Negative for pain and visual disturbance.  Respiratory: Negative for cough and shortness of breath.   Cardiovascular: Negative for cp or palpitations    Gastrointestinal: Negative for nausea, diarrhea and constipation.  Genitourinary: Negative for urgency and frequency.  Skin: Negative for pallor or rash   Neurological: Negative for  weakness, light-headedness, numbness and headaches.  Hematological: Negative for adenopathy. Does not bruise/bleed easily.  Psychiatric/Behavioral: Negative for dysphoric mood. The patient is not nervous/anxious.         Objective:   Physical Exam  Constitutional: She appears well-developed and well-nourished. No distress.  HENT:  Head: Normocephalic and atraumatic.  Right Ear: External ear normal.  Left Ear: External ear normal.  Nose: Nose normal.  Mouth/Throat: Oropharynx is clear and moist.  Eyes: Conjunctivae and EOM are normal. Pupils are equal, round, and reactive to light. Right eye exhibits no discharge. Left eye exhibits no discharge. No scleral icterus.  Neck: Normal range of motion. Neck supple. No JVD present. Carotid bruit is not present. No thyromegaly present.  Prominent symmetric thyroid -but not enlarged   Cardiovascular: Normal rate, regular rhythm, normal heart sounds and intact distal pulses.  Exam reveals no gallop.   Pulmonary/Chest: Effort normal and breath sounds normal. No respiratory distress. She has no wheezes. She has no rales.  Abdominal: Soft. Bowel sounds are normal. She exhibits no distension, no abdominal bruit and no mass. There is no tenderness.  Genitourinary: Vagina normal and uterus normal. No breast swelling, tenderness, discharge or bleeding. There is no rash, tenderness or lesion on the right labia. There is no rash, tenderness or lesion on the left labia. Uterus is  not enlarged and not tender. Cervix exhibits discharge. Cervix exhibits no motion tenderness and no friability. Right adnexum displays no mass, no tenderness and no fullness. Left adnexum displays no mass, no tenderness and no fullness. No bleeding around the vagina. No vaginal discharge found.  Clear cervical d/c  Breast exam: No mass, nodules, thickening, tenderness, bulging, retraction, inflamation, nipple discharge or skin changes noted.  No axillary or clavicular LA.  Chaperoned  exam.    Musculoskeletal: She exhibits no edema and no tenderness.  Lymphadenopathy:    She has no cervical adenopathy.  Neurological: She is alert. She has normal reflexes. No cranial nerve deficit. She exhibits normal muscle tone. Coordination normal.  Skin: Skin is warm and dry. No rash noted. No erythema. No pallor.  Psychiatric: She has a normal mood and affect.          Assessment & Plan:

## 2013-02-14 NOTE — Patient Instructions (Addendum)
Flu vaccine today Pap smear today  Be sure to get a prenatal vitamin over the counter and take one daily as long as you do not use birth control  Keep exercising and take care of yourself

## 2013-02-14 NOTE — Assessment & Plan Note (Signed)
Reviewed health habits including diet and exercise and skin cancer prevention Also reviewed health mt list, fam hx and immunizations   Flu vaccine today

## 2013-02-14 NOTE — Assessment & Plan Note (Signed)
Annual exam / pap in pt with remote hx of HPV    (then had vaccine) Nl pap 1 y ago Rev pre conception counseling - and need to take PNV daily

## 2014-01-09 ENCOUNTER — Ambulatory Visit (INDEPENDENT_AMBULATORY_CARE_PROVIDER_SITE_OTHER): Payer: BC Managed Care – PPO | Admitting: Family Medicine

## 2014-01-09 ENCOUNTER — Encounter: Payer: Self-pay | Admitting: Family Medicine

## 2014-01-09 VITALS — BP 104/72 | HR 69 | Temp 98.1°F | Ht 66.0 in | Wt 164.2 lb

## 2014-01-09 DIAGNOSIS — K644 Residual hemorrhoidal skin tags: Secondary | ICD-10-CM | POA: Insufficient documentation

## 2014-01-09 NOTE — Assessment & Plan Note (Signed)
Tiny anal skin tag (or poss very small ext hemorrhoid) at 6:00 It has itched  Recommend TUKS pads to clean after BMs  Update if this becomes symptomatic/grows

## 2014-01-09 NOTE — Progress Notes (Signed)
Pre visit review using our clinic review tool, if applicable. No additional management support is needed unless otherwise documented below in the visit note. 

## 2014-01-09 NOTE — Patient Instructions (Signed)
I think you have an anal skin tag / or less likely an external hemorrhoid  If it bothers you try TUKS medicated pads over the counter - to clean after bowel movements Let me know if it becomes bothersome

## 2014-01-09 NOTE — Progress Notes (Signed)
Subjective:    Patient ID: Rita Salas, female    DOB: 15-Jun-1985, 28 y.o.   MRN: [id number]  HPI Here with a bump near her rectum Hx of hpv/ genital warts   Had something come up near rectum Does not hurt/ just itches  Thinks she noticed it in July- it more prominent now   No blood in stool   No active warts in vaginal area - no problems since it first happened (8-9 years)   With one sexual partner right now (husband)   Has been a little itchy -all over / 2 weeks since she went to [place]  No rash however   Patient Active Problem List   Diagnosis Date Noted  . Routine gynecological examination 11/16/2011  . Routine general medical examination at a health care facility 11/16/2011  . MOOD SWINGS 09/12/2009  . 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 ALLERGIC RHINITIS 11/10/2006   No past medical history on file. No past surgical history on file. History  Substance Use Topics  . Smoking status: Former Games developer  . Smokeless tobacco: Not on file  . Alcohol Use: Yes     Comment: occassionally   No family history on file. No Known Allergies No current outpatient prescriptions on file prior to visit.   No current facility-administered medications on file prior to visit.    Review of Systems Review of Systems  Constitutional: Negative for fever, appetite change, fatigue and unexpected weight change.  Eyes: Negative for pain and visual disturbance.  Respiratory: Negative for cough and shortness of breath.   Cardiovascular: Negative for cp or palpitations    Gastrointestinal: Negative for nausea, diarrhea and constipation. neg for blood in stool  Genitourinary: Negative for urgency and frequency. neg for vag d/c or lesions  Skin: Negative for pallor or rash   Neurological: Negative for weakness, light-headedness, numbness and headaches.  Hematological: Negative for adenopathy. Does not bruise/bleed easily.  Psychiatric/Behavioral: Negative for dysphoric mood. The patient  is not nervous/anxious.         Objective:   Physical Exam  Constitutional: She appears well-developed and well-nourished. No distress.  HENT:  Head: Normocephalic and atraumatic.  Eyes: Conjunctivae and EOM are normal. Pupils are equal, round, and reactive to light.  Neck: Normal range of motion. Neck supple.  Cardiovascular: Normal rate, regular rhythm and normal heart sounds.   Pulmonary/Chest: Effort normal and breath sounds normal.  Abdominal: Soft. Bowel sounds are normal. She exhibits no distension and no mass. There is no tenderness. There is no rebound and no guarding.  No suprapubic tenderness or fullness    Genitourinary: Rectum normal. Rectal exam shows no mass, no tenderness and anal tone normal.  Small anal skin tag or poss external hemorrhoid non thrombosed at 6:00 nontender Nl app rectum  No genital warts seen   Musculoskeletal: She exhibits no edema.  Lymphadenopathy:    She has no cervical adenopathy.  Neurological: She is alert.  Skin: Skin is warm and dry. No rash noted. No erythema. No pallor.  Psychiatric: She has a normal mood and affect.          Assessment & Plan:   Problem List Items Addressed This Visit     Digestive   Anal skin tag - Primary     Tiny anal skin tag (or poss very small ext hemorrhoid) at 6:00 It has itched  Recommend TUKS pads to clean after BMs  Update if this becomes symptomatic/grows

## 2014-02-19 ENCOUNTER — Ambulatory Visit (INDEPENDENT_AMBULATORY_CARE_PROVIDER_SITE_OTHER): Payer: BC Managed Care – PPO | Admitting: Family Medicine

## 2014-02-19 ENCOUNTER — Encounter: Payer: Self-pay | Admitting: Family Medicine

## 2014-02-19 VITALS — BP 102/66 | HR 81 | Temp 98.6°F | Ht 65.5 in | Wt 165.0 lb

## 2014-02-19 DIAGNOSIS — K644 Residual hemorrhoidal skin tags: Secondary | ICD-10-CM

## 2014-02-19 DIAGNOSIS — Z23 Encounter for immunization: Secondary | ICD-10-CM

## 2014-02-19 DIAGNOSIS — E049 Nontoxic goiter, unspecified: Secondary | ICD-10-CM

## 2014-02-19 DIAGNOSIS — Z0000 Encounter for general adult medical examination without abnormal findings: Secondary | ICD-10-CM

## 2014-02-19 LAB — CBC WITH DIFFERENTIAL/PLATELET
Basophils Absolute: 0 10*3/uL (ref 0.0–0.1)
Basophils Relative: 0.3 % (ref 0.0–3.0)
EOS PCT: 1 % (ref 0.0–5.0)
Eosinophils Absolute: 0.1 10*3/uL (ref 0.0–0.7)
HEMATOCRIT: 37 % (ref 36.0–46.0)
HEMOGLOBIN: 12.3 g/dL (ref 12.0–15.0)
LYMPHS ABS: 1.8 10*3/uL (ref 0.7–4.0)
Lymphocytes Relative: 21.2 % (ref 12.0–46.0)
MCHC: 33.2 g/dL (ref 30.0–36.0)
MCV: 89.5 fl (ref 78.0–100.0)
MONOS PCT: 7.8 % (ref 3.0–12.0)
Monocytes Absolute: 0.6 10*3/uL (ref 0.1–1.0)
NEUTROS ABS: 5.8 10*3/uL (ref 1.4–7.7)
Neutrophils Relative %: 69.7 % (ref 43.0–77.0)
Platelets: 238 10*3/uL (ref 150.0–400.0)
RBC: 4.13 Mil/uL (ref 3.87–5.11)
RDW: 12.6 % (ref 11.5–15.5)
WBC: 8.3 10*3/uL (ref 4.0–10.5)

## 2014-02-19 LAB — LIPID PANEL
Cholesterol: 137 mg/dL (ref 0–200)
HDL: 39.7 mg/dL (ref >39.00)
LDL Cholesterol: 72 mg/dL (ref 0–99)
NONHDL: 97.3
TRIGLYCERIDES: 126 mg/dL (ref 0.0–149.0)
Total CHOL/HDL Ratio: 3
VLDL: 25.2 mg/dL (ref 0.0–40.0)

## 2014-02-19 LAB — COMPREHENSIVE METABOLIC PANEL
ALT: 10 U/L (ref 0–35)
AST: 18 U/L (ref 0–37)
Albumin: 4.2 g/dL (ref 3.5–5.2)
Alkaline Phosphatase: 75 U/L (ref 39–117)
BILIRUBIN TOTAL: 0.4 mg/dL (ref 0.2–1.2)
BUN: 11 mg/dL (ref 6–23)
CO2: 25 meq/L (ref 19–32)
CREATININE: 0.8 mg/dL (ref 0.4–1.2)
Calcium: 9.2 mg/dL (ref 8.4–10.5)
Chloride: 105 mEq/L (ref 96–112)
GFR: 89.76 mL/min (ref >60.00)
GLUCOSE: 86 mg/dL (ref 70–99)
Potassium: 3.8 mEq/L (ref 3.5–5.1)
Sodium: 138 mEq/L (ref 135–145)
Total Protein: 8.1 g/dL (ref 6.0–8.3)

## 2014-02-19 LAB — TSH: TSH: 0.8 u[IU]/mL (ref 0.35–4.50)

## 2014-02-19 LAB — T4, FREE: Free T4: 0.75 ng/dL (ref 0.60–1.60)

## 2014-02-19 MED ORDER — HYDROCORTISONE 2.5 % RE CREA: 1.0000 "application " | TOPICAL_CREAM | Freq: Two times a day (BID) | RECTAL | Status: DC

## 2014-02-19 NOTE — Assessment & Plan Note (Signed)
New-on exam today Seems to be symmetric No symptoms  Lab today Thyroid US planned Fu planned

## 2014-02-19 NOTE — Assessment & Plan Note (Signed)
This itches more - suspect ext hemorrhoid  Px anusol hc cream  Check at 2 wk f/u

## 2014-02-19 NOTE — Patient Instructions (Addendum)
Flu vaccine today  Stop at check out for referral for a thyroid ultrasound  Labs today  Use anusol hc for rectal itching and we will re evaluate it at follow up   We will review results at follow up

## 2014-02-19 NOTE — Progress Notes (Signed)
Subjective:    Patient ID: Rita Salas, female    DOB: [birth year], 28 y.o.   MRN: [id number]  HPI Here for health maintenance exam and to review chronic medical problems  Has been feeling fine   Still has problems with itchy skin tag   TUKS pads sometimes work and sometimes do not  Has not tried any steroid creams    Wt is stable  bmi is 27  Flu vaccine - will get that today   Pap 10/14 - cannot do today - period started today , so she made appt in 2 weeks  Self breast exam   Td 7/09   Last labs were in 2013 Will do a wellness panel today  Diet is "the same" - pretty good overall Lab Results  Component Value Date   CHOL 132 11/16/2011   HDL 47.50 11/16/2011   LDLCALC 71 11/16/2011   TRIG 67.0 11/16/2011   CHOLHDL 3 11/16/2011    Walking when the weather allows it for exercise   Patient Active Problem List   Diagnosis Date Noted  . Anal skin tag 01/09/2014  . Encounter for routine gynecological examination 11/16/2011  . Routine general medical examination at a health care facility 11/16/2011  . MOOD SWINGS 09/12/2009  . 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 ALLERGIC RHINITIS 11/10/2006   No past medical history on file. No past surgical history on file. History  Substance Use Topics  . Smoking status: Former Games developer  . Smokeless tobacco: Not on file  . Alcohol Use: Yes     Comment: occassionally   No family history on file. No Known Allergies No current outpatient prescriptions on file prior to visit.   No current facility-administered medications on file prior to visit.      Review of Systems Review of Systems  Constitutional: Negative for fever, appetite change, fatigue and unexpected weight change.  Eyes: Negative for pain and visual disturbance.  Respiratory: Negative for cough and shortness of breath.   Cardiovascular: Negative for cp or palpitations    Gastrointestinal: Negative for nausea, diarrhea and constipation.  Genitourinary: Negative for  urgency and frequency.  Skin: Negative for pallor or rash  pos for rectal itching  Neurological: Negative for weakness, light-headedness, numbness and headaches.  Hematological: Negative for adenopathy. Does not bruise/bleed easily.  Psychiatric/Behavioral: Negative for dysphoric mood. The patient is not nervous/anxious.         Objective:   Physical Exam  Constitutional: She appears well-developed and well-nourished. No distress.  HENT:  Head: Normocephalic and atraumatic.  Right Ear: External ear normal.  Left Ear: External ear normal.  Nose: Nose normal.  Mouth/Throat: Oropharynx is clear and moist.  Eyes: Conjunctivae and EOM are normal. Pupils are equal, round, and reactive to light. Right eye exhibits no discharge. Left eye exhibits no discharge. No scleral icterus.  Neck: Normal range of motion. Neck supple. No JVD present. Thyromegaly present.  Symmetric thyroid enl noted   Cardiovascular: Normal rate, regular rhythm, normal heart sounds and intact distal pulses.  Exam reveals no gallop.   Pulmonary/Chest: Effort normal and breath sounds normal. No respiratory distress. She has no wheezes. She has no rales.  Abdominal: Soft. Bowel sounds are normal. She exhibits no distension and no mass. There is no tenderness.  Musculoskeletal: She exhibits no edema and no tenderness.  Lymphadenopathy:    She has no cervical adenopathy.  Neurological: She is alert. She has normal reflexes. She displays no tremor. No cranial nerve  deficit. She exhibits normal muscle tone. Coordination normal.  Skin: Skin is warm and dry. No rash noted. No erythema. No pallor.  Psychiatric: She has a normal mood and affect.          Assessment & Plan:   Problem List Items Addressed This Visit     Digestive   Anal skin tag     This itches more - suspect ext hemorrhoid  Px anusol hc cream  Check at 2 wk f/u      Endocrine   Enlarged thyroid gland     New-on exam today Seems to be symmetric No  symptoms  Lab today Thyroid US planned Fu planned     Relevant Orders      US Soft Tissue Head/Neck      TSH (Completed)      T4, Free (Completed)     Other   Routine general medical examination at a health care facility - Primary     Reviewed health habits including diet and exercise and skin cancer prevention Reviewed appropriate screening tests for age  Also reviewed health mt list, fam hx and immunization status , as well as social and family history   Labs today  Flu shot today    Relevant Orders      CBC with Differential (Completed)      Comprehensive metabolic panel (Completed)      TSH (Completed)      Lipid panel (Completed)    Other Visit Diagnoses   Need for prophylactic vaccination and inoculation against influenza        Relevant Orders       Flu Vaccine QUAD 36+ mos PF IM (Fluarix Quad PF) (Completed)

## 2014-02-19 NOTE — Assessment & Plan Note (Signed)
Reviewed health habits including diet and exercise and skin cancer prevention Reviewed appropriate screening tests for age  Also reviewed health mt list, fam hx and immunization status , as well as social and family history   Labs today  Flu shot today

## 2014-02-19 NOTE — Progress Notes (Signed)
Pre visit review using our clinic review tool, if applicable. No additional management support is needed unless otherwise documented below in the visit note. 

## 2014-02-20 ENCOUNTER — Encounter: Payer: Self-pay | Admitting: Family Medicine

## 2014-02-26 ENCOUNTER — Ambulatory Visit: Payer: Self-pay | Admitting: Family Medicine

## 2014-02-27 ENCOUNTER — Encounter: Payer: Self-pay | Admitting: Family Medicine

## 2014-03-01 ENCOUNTER — Encounter: Payer: Self-pay | Admitting: Family Medicine

## 2014-03-05 ENCOUNTER — Other Ambulatory Visit (HOSPITAL_COMMUNITY)
Admission: RE | Admit: 2014-03-05 | Discharge: 2014-03-05 | Disposition: A | Discharge status: Home / Self Care | Payer: BC Managed Care – PPO | Source: Ambulatory Visit | Attending: Family Medicine | Admitting: Family Medicine

## 2014-03-05 ENCOUNTER — Ambulatory Visit (INDEPENDENT_AMBULATORY_CARE_PROVIDER_SITE_OTHER): Payer: BC Managed Care – PPO | Admitting: Family Medicine

## 2014-03-05 ENCOUNTER — Encounter: Payer: Self-pay | Admitting: Family Medicine

## 2014-03-05 VITALS — BP 122/66 | HR 83 | Temp 98.2°F | Ht 65.5 in | Wt 169.8 lb

## 2014-03-05 DIAGNOSIS — Z01419 Encounter for gynecological examination (general) (routine) without abnormal findings: Secondary | ICD-10-CM | POA: Insufficient documentation

## 2014-03-05 NOTE — Progress Notes (Signed)
Pre visit review using our clinic review tool, if applicable. No additional management support is needed unless otherwise documented below in the visit note. 

## 2014-03-05 NOTE — Assessment & Plan Note (Signed)
No problems  Using condoms for contraception , declines std testing/monogamous  Pap obt Remote hx of hpv No symptoms

## 2014-03-05 NOTE — Progress Notes (Signed)
   Subjective:    Patient ID: Rita Salas, female    DOB: Apr 14, 1986, 28 y.o.   MRN: [id number]  HPI Here for gyn exam  Last pap was normal  Not on any contraceptive  Does not want any -will continue to use condoms  Periods - last one shorter than usual, normally 3 days-not too heavy or painful  Is regular   Patient Active Problem List   Diagnosis Date Noted  . Enlarged thyroid gland 02/19/2014  . Anal skin tag 01/09/2014  . Encounter for routine gynecological examination 11/16/2011  . Routine general medical examination at a health care facility 11/16/2011  . MOOD SWINGS 09/12/2009  . 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 ALLERGIC RHINITIS 11/10/2006   No past medical history on file. No past surgical history on file. History  Substance Use Topics  . Smoking status: Former Games developermoker  . Smokeless tobacco: Not on file  . Alcohol Use: Yes     Comment: occassionally   No family history on file. No Known Allergies Current Outpatient Prescriptions on File Prior to Visit  Medication Sig Dispense Refill  . hydrocortisone (ANUSOL-HC) 2.5 % rectal cream Place 1 application rectally 2 (two) times daily.  30 g  0   No current facility-administered medications on file prior to visit.      Review of Systems Review of Systems  Constitutional: Negative for fever, appetite change, fatigue and unexpected weight change.  Eyes: Negative for pain and visual disturbance.  Respiratory: Negative for cough and shortness of breath.   Cardiovascular: Negative for cp or palpitations    Gastrointestinal: Negative for nausea, diarrhea and constipation.  Genitourinary: Negative for urgency and frequency. neg for period problems  Skin: Negative for pallor or rash   Neurological: Negative for weakness, light-headedness, numbness and headaches.  Hematological: Negative for adenopathy. Does not bruise/bleed easily.  Psychiatric/Behavioral: Negative for dysphoric mood. The patient is not  nervous/anxious.         Objective:   Physical Exam  Genitourinary: Uterus normal. No breast swelling, tenderness, discharge or bleeding. There is no rash, tenderness or lesion on the right labia. There is no rash, tenderness or lesion on the left labia. Uterus is not tender. Cervix exhibits no motion tenderness, no discharge and no friability. Right adnexum displays no mass, no tenderness and no fullness. Left adnexum displays no mass, no tenderness and no fullness. No tenderness or bleeding around the vagina. No vaginal discharge found.  Breast exam: No mass, nodules, thickening, tenderness, bulging, retraction, inflamation, nipple discharge or skin changes noted.  No axillary or clavicular LA.    Posterior cervix   Neurological: She is alert.  Skin: Skin is warm and dry. No rash noted. No pallor.  Psychiatric: She has a normal mood and affect.          Assessment & Plan:   Problem List Items Addressed This Visit     Other   Encounter for routine gynecological examination - Primary     No problems  Using condoms for contraception , declines std testing/monogamous  Pap obt Remote hx of hpv No symptoms      Relevant Orders      Cytology - PAP

## 2014-03-05 NOTE — Patient Instructions (Signed)
Pap done today  If you decide you want birth control at all let me know  Take care of yourself

## 2014-03-07 ENCOUNTER — Encounter: Payer: Self-pay | Admitting: Family Medicine

## 2014-03-07 LAB — CYTOLOGY - PAP

## 2014-03-09 ENCOUNTER — Ambulatory Visit (INDEPENDENT_AMBULATORY_CARE_PROVIDER_SITE_OTHER): Payer: BC Managed Care – PPO | Admitting: Family Medicine

## 2014-03-09 ENCOUNTER — Encounter: Payer: Self-pay | Admitting: Family Medicine

## 2014-03-09 VITALS — BP 104/70 | HR 99 | Temp 98.6°F | Ht 65.5 in | Wt 165.5 lb

## 2014-03-09 DIAGNOSIS — R300 Dysuria: Secondary | ICD-10-CM

## 2014-03-09 DIAGNOSIS — N390 Urinary tract infection, site not specified: Secondary | ICD-10-CM | POA: Insufficient documentation

## 2014-03-09 DIAGNOSIS — N3000 Acute cystitis without hematuria: Secondary | ICD-10-CM

## 2014-03-09 LAB — POCT URINALYSIS DIPSTICK
Bilirubin, UA: NEGATIVE
Blood, UA: NEGATIVE
Glucose, UA: NEGATIVE
KETONES UA: NEGATIVE
Nitrite, UA: NEGATIVE
PH UA: 6
PROTEIN UA: NEGATIVE
Spec Grav, UA: 1.02
Urobilinogen, UA: 0.2

## 2014-03-09 MED ORDER — SULFAMETHOXAZOLE-TMP DS 800-160 MG PO TABS: 1.0000 | ORAL_TABLET | Freq: Two times a day (BID) | ORAL | Status: DC

## 2014-03-09 NOTE — Progress Notes (Signed)
Subjective:    Patient ID: Rita Salas, female    DOB: 12-06-1985, 28 y.o.   MRN: [id number]  HPI Here for urinary symptoms -started wednesday Has discomfort to urinate - mild  A little urgency  No frequency No blood in urine  No bladder pain  No fever or flank pain   No vaginal discharge   She drinks lots of water    Results for orders placed in visit on 03/09/14  POCT URINALYSIS DIPSTICK      Result Value Ref Range   Color, UA yellow     Clarity, UA clear     Glucose, UA neg.     Bilirubin, UA neg.     Ketones, UA neg.     Spec Grav, UA 1.020     Blood, UA neg.     pH, UA 6.0     Protein, UA neg.     Urobilinogen, UA 0.2     Nitrite, UA neg.     Leukocytes, UA Trace       Patient Active Problem List   Diagnosis Date Noted  . UTI (urinary tract infection) 03/09/2014  . Enlarged thyroid gland 02/19/2014  . Anal skin tag 01/09/2014  . Encounter for routine gynecological examination 11/16/2011  . Routine general medical examination at a health care facility 11/16/2011  . MOOD SWINGS 09/12/2009  . 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 ALLERGIC RHINITIS 11/10/2006   No past medical history on file. No past surgical history on file. History  Substance Use Topics  . Smoking status: Former Games developermoker  . Smokeless tobacco: Not on file  . Alcohol Use: Yes     Comment: occassionally   No family history on file. No Known Allergies Current Outpatient Prescriptions on File Prior to Visit  Medication Sig Dispense Refill  . hydrocortisone (ANUSOL-HC) 2.5 % rectal cream Place 1 application rectally 2 (two) times daily.  30 g  0   No current facility-administered medications on file prior to visit.    Review of Systems Review of Systems  Constitutional: Negative for fever, appetite change, fatigue and unexpected weight change.  Eyes: Negative for pain and visual disturbance.  Respiratory: Negative for cough and shortness of breath.   Cardiovascular: Negative for cp  or palpitations    Gastrointestinal: Negative for nausea, diarrhea and constipation.  Genitourinary: Negative for urgency and neg for frequency/hematuria or flank pain  Skin: Negative for pallor or rash   Neurological: Negative for weakness, light-headedness, numbness and headaches.  Hematological: Negative for adenopathy. Does not bruise/bleed easily.  Psychiatric/Behavioral: Negative for dysphoric mood. The patient is not nervous/anxious.         Objective:   Physical Exam  Constitutional: She appears well-developed and well-nourished. No distress.  HENT:  Head: Normocephalic and atraumatic.  Eyes: Conjunctivae and EOM are normal. Pupils are equal, round, and reactive to light. No scleral icterus.  Neck: Normal range of motion. Neck supple.  Cardiovascular: Normal rate, regular rhythm and normal heart sounds.   Pulmonary/Chest: Effort normal and breath sounds normal.  Abdominal: Soft. Bowel sounds are normal. She exhibits no distension and no mass. There is tenderness. There is no rebound and no guarding.  Slight suprapubic tenderness No cva tenderness   Neurological: She is alert.  Skin: Skin is warm and dry. No erythema. No pallor.  Psychiatric: She has a normal mood and affect.          Assessment & Plan:   Problem List Items Addressed  This Visit     Genitourinary   UTI (urinary tract infection)     Mild symptoms and tr leukocytes on ua cx pending  tx with bactrim for 5 d Enc water intake Update if no imp in 2 d or worse     Relevant Medications      sulfamethoxazole-trimethoprim (BACTRIM/SEPTRA DS) tablet 800-160 mg   Other Relevant Orders      Urine culture (Completed)    Other Visit Diagnoses   Dysuria    -  Primary    Relevant Orders       POCT urinalysis dipstick (Completed)

## 2014-03-09 NOTE — Patient Instructions (Signed)
Drink lots of water  Take the antibiotic as directed  We will culture urine and call with a result  Update if not starting to improve in several days or if worsening

## 2014-03-09 NOTE — Progress Notes (Signed)
Pre visit review using our clinic review tool, if applicable. No additional management support is needed unless otherwise documented below in the visit note. 

## 2014-03-11 LAB — URINE CULTURE
Colony Count: NO GROWTH
Organism ID, Bacteria: NO GROWTH

## 2014-03-11 NOTE — Assessment & Plan Note (Signed)
Mild symptoms and tr leukocytes on ua cx pending  tx with bactrim for 5 d Enc water intake Update if no imp in 2 d or worse

## 2014-07-04 ENCOUNTER — Encounter: Payer: Self-pay | Admitting: Family Medicine

## 2014-07-05 ENCOUNTER — Telehealth: Payer: Self-pay | Admitting: Family Medicine

## 2014-07-05 DIAGNOSIS — K644 Residual hemorrhoidal skin tags: Secondary | ICD-10-CM

## 2014-07-05 NOTE — Telephone Encounter (Signed)
Ref to derm for anal skin problem

## 2014-08-01 ENCOUNTER — Encounter: Payer: Self-pay | Admitting: Family Medicine

## 2015-02-25 ENCOUNTER — Other Ambulatory Visit (HOSPITAL_COMMUNITY)
Admission: RE | Admit: 2015-02-25 | Discharge: 2015-02-25 | Disposition: A | Discharge status: Home / Self Care | Payer: BLUE CROSS/BLUE SHIELD | Source: Ambulatory Visit | Attending: Family Medicine | Admitting: Family Medicine

## 2015-02-25 ENCOUNTER — Ambulatory Visit (INDEPENDENT_AMBULATORY_CARE_PROVIDER_SITE_OTHER): Payer: BLUE CROSS/BLUE SHIELD | Admitting: Family Medicine

## 2015-02-25 ENCOUNTER — Encounter: Payer: Self-pay | Admitting: Family Medicine

## 2015-02-25 VITALS — BP 128/78 | HR 80 | Temp 99.0°F | Ht 66.0 in | Wt 168.8 lb

## 2015-02-25 DIAGNOSIS — Z01419 Encounter for gynecological examination (general) (routine) without abnormal findings: Secondary | ICD-10-CM

## 2015-02-25 DIAGNOSIS — Z1151 Encounter for screening for human papillomavirus (HPV): Secondary | ICD-10-CM | POA: Insufficient documentation

## 2015-02-25 DIAGNOSIS — Z114 Encounter for screening for human immunodeficiency virus [HIV]: Secondary | ICD-10-CM | POA: Diagnosis not present

## 2015-02-25 DIAGNOSIS — Z0000 Encounter for general adult medical examination without abnormal findings: Secondary | ICD-10-CM | POA: Diagnosis not present

## 2015-02-25 DIAGNOSIS — Z23 Encounter for immunization: Secondary | ICD-10-CM | POA: Diagnosis not present

## 2015-02-25 LAB — COMPREHENSIVE METABOLIC PANEL
ALBUMIN: 4.2 g/dL (ref 3.5–5.2)
ALT: 11 U/L (ref 0–35)
AST: 15 U/L (ref 0–37)
Alkaline Phosphatase: 80 U/L (ref 39–117)
BUN: 11 mg/dL (ref 6–23)
CO2: 27 meq/L (ref 19–32)
CREATININE: 0.6 mg/dL (ref 0.40–1.20)
Calcium: 9.5 mg/dL (ref 8.4–10.5)
Chloride: 106 mEq/L (ref 96–112)
GFR: 124.24 mL/min (ref >60.00)
Glucose, Bld: 93 mg/dL (ref 70–99)
Potassium: 3.9 mEq/L (ref 3.5–5.1)
SODIUM: 139 meq/L (ref 135–145)
Total Bilirubin: 0.7 mg/dL (ref 0.2–1.2)
Total Protein: 7.6 g/dL (ref 6.0–8.3)

## 2015-02-25 LAB — LIPID PANEL
CHOL/HDL RATIO: 3
Cholesterol: 133 mg/dL (ref 0–200)
HDL: 49 mg/dL (ref >39.00)
LDL CALC: 73 mg/dL (ref 0–99)
NonHDL: 84
TRIGLYCERIDES: 57 mg/dL (ref 0.0–149.0)
VLDL: 11.4 mg/dL (ref 0.0–40.0)

## 2015-02-25 LAB — CBC WITH DIFFERENTIAL/PLATELET
BASOS PCT: 0.3 % (ref 0.0–3.0)
Basophils Absolute: 0 10*3/uL (ref 0.0–0.1)
EOS ABS: 0.1 10*3/uL (ref 0.0–0.7)
Eosinophils Relative: 0.8 % (ref 0.0–5.0)
HCT: 39.7 % (ref 36.0–46.0)
Hemoglobin: 13.2 g/dL (ref 12.0–15.0)
Lymphocytes Relative: 22.7 % (ref 12.0–46.0)
Lymphs Abs: 1.7 10*3/uL (ref 0.7–4.0)
MCHC: 33.3 g/dL (ref 30.0–36.0)
MCV: 88.7 fl (ref 78.0–100.0)
Monocytes Absolute: 0.6 10*3/uL (ref 0.1–1.0)
Monocytes Relative: 8.6 % (ref 3.0–12.0)
Neutro Abs: 5 10*3/uL (ref 1.4–7.7)
Neutrophils Relative %: 67.6 % (ref 43.0–77.0)
PLATELETS: 247 10*3/uL (ref 150.0–400.0)
RBC: 4.47 Mil/uL (ref 3.87–5.11)
RDW: 12.6 % (ref 11.5–15.5)
WBC: 7.4 10*3/uL (ref 4.0–10.5)

## 2015-02-25 LAB — TSH: TSH: 1.51 u[IU]/mL (ref 0.35–4.50)

## 2015-02-25 NOTE — Assessment & Plan Note (Signed)
Low risk  Will add to labs

## 2015-02-25 NOTE — Progress Notes (Signed)
Pre visit review using our clinic review tool, if applicable. No additional management support is needed unless otherwise documented below in the visit note. 

## 2015-02-25 NOTE — Assessment & Plan Note (Addendum)
Routine exam and pap  Remote hx of HPV  No complaints

## 2015-02-25 NOTE — Progress Notes (Signed)
Subjective:    Patient ID: Rita Salas, female    DOB: Oct 14, 1985, 29 y.o.   MRN: [id number]  HPI Here for health maintenance exam and to review chronic medical problems    Has had a busy year - stressful  A lot of weddings - has been in them  Last one was this weekend -then will get a break   Too busy to take care of herself  Still has the "thing" going on with her rectum  Trouble with concentration  Brain is overwhelmed lately   Is exercising - she walks and runs regularly  Does eat a healthy diet    Wt is up 3 lb with bmi of 27  Flu shot today  Pap 1/15 nl  Remote hx of HPV in the past  No gyn problems  Menses - usually regular - did have 2 periods in sept  Sometimes she has some cramps (never had them before)- feels like pressure  Usually menses lasts 2-3 days/ short and a little heavy early on  Not on birth control  Using condoms She is not trying to get pregnant  An OC "made her crazy" in the past-not eager to try another pill-made her anxious    Td 7/09  HIV 8/11 neg  (had a needle stick in the past)   Due for labs   BP Readings from Last 3 Encounters:  02/25/15 128/78  03/09/14 104/70  03/05/14 122/66     Patient Active Problem List   Diagnosis Date Noted  . UTI (urinary tract infection) 03/09/2014  . Enlarged thyroid gland 02/19/2014  . Anal skin tag 01/09/2014  . Encounter for routine gynecological examination 11/16/2011  . Routine general medical examination at a health care facility 11/16/2011  . MOOD SWINGS 09/12/2009  . 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 ALLERGIC RHINITIS 11/10/2006   No past medical history on file. No past surgical history on file. Social History  Substance Use Topics  . Smoking status: Former Games developer  . Smokeless tobacco: None  . Alcohol Use: 0.0 oz/week    0 Standard drinks or equivalent per week     Comment: occassionally   No family history on file. No Known Allergies No current outpatient  prescriptions on file prior to visit.   No current facility-administered medications on file prior to visit.    Review of Systems Review of Systems  Constitutional: Negative for fever, appetite change, fatigue and unexpected weight change.  Eyes: Negative for pain and visual disturbance.  Respiratory: Negative for cough and shortness of breath.   Cardiovascular: Negative for cp or palpitations    Gastrointestinal: Negative for nausea, diarrhea and constipation.  Genitourinary: Negative for urgency and frequency.  Skin: Negative for pallor or rash   Neurological: Negative for weakness, light-headedness, numbness and headaches.  Hematological: Negative for adenopathy. Does not bruise/bleed easily.  Psychiatric/Behavioral: Negative for dysphoric mood. The patient is not nervous/anxious.         Objective:   Physical Exam  Constitutional: She appears well-developed and well-nourished. No distress.  Well appearing   HENT:  Head: Normocephalic and atraumatic.  Right Ear: External ear normal.  Left Ear: External ear normal.  Mouth/Throat: Oropharynx is clear and moist.  Eyes: Conjunctivae and EOM are normal. Pupils are equal, round, and reactive to light. No scleral icterus.  Neck: Normal range of motion. Neck supple. No JVD present. Carotid bruit is not present. No thyromegaly present.  Prominent thyroid  No change from  prev exam  Cardiovascular: Normal rate, regular rhythm, normal heart sounds and intact distal pulses.  Exam reveals no gallop.   Pulmonary/Chest: Effort normal and breath sounds normal. No respiratory distress. She has no wheezes. She exhibits no tenderness.  Abdominal: Soft. Bowel sounds are normal. She exhibits no distension, no abdominal bruit and no mass. There is no tenderness.  Genitourinary: Vagina normal and uterus normal. No breast swelling, tenderness, discharge or bleeding. There is no rash, tenderness or lesion on the right labia. There is no rash, tenderness  or lesion on the left labia. Uterus is not enlarged and not tender. Cervix exhibits no motion tenderness, no discharge and no friability. Right adnexum displays no mass, no tenderness and no fullness. Left adnexum displays no mass, no tenderness and no fullness. No bleeding in the vagina. No vaginal discharge found.  Breast exam: No mass, nodules, thickening, tenderness, bulging, retraction, inflamation, nipple discharge or skin changes noted.  No axillary or clavicular LA.      Musculoskeletal: Normal range of motion. She exhibits no edema or tenderness.  Lymphadenopathy:    She has no cervical adenopathy.  Neurological: She is alert. She has normal reflexes. No cranial nerve deficit. She exhibits normal muscle tone. Coordination normal.  Skin: Skin is warm and dry. No rash noted. No erythema. No pallor.  Psychiatric: She has a normal mood and affect.          Assessment & Plan:   Problem List Items Addressed This Visit      Other   Encounter for routine gynecological examination    Routine exam and pap  Remote hx of HPV  No complaints       Relevant Orders   Cytology - PAP   Routine general medical examination at a health care facility    Reviewed health habits including diet and exercise and skin cancer prevention Reviewed appropriate screening tests for age  Also reviewed health mt list, fam hx and immunization status , as well as social and family history   Labs today Flu shot today Pap today  Keep exercising        Relevant Orders   CBC with Differential/Platelet (Completed)   Comprehensive metabolic panel (Completed)   TSH (Completed)   Lipid panel (Completed)   Screening for HIV (human immunodeficiency virus)    Low risk  Will add to labs       Relevant Orders   HIV antibody (with reflex)    Other Visit Diagnoses    Need for influenza vaccination    -  Primary    Relevant Orders    Flu Vaccine QUAD 36+ mos PF IM (Fluarix & Fluzone Quad PF) (Completed)

## 2015-02-25 NOTE — Patient Instructions (Signed)
Flu shot today Pap today  Labs today  Keep exercising  Take care of yourself

## 2015-02-25 NOTE — Assessment & Plan Note (Signed)
Reviewed health habits including diet and exercise and skin cancer prevention Reviewed appropriate screening tests for age  Also reviewed health mt list, fam hx and immunization status , as well as social and family history   Labs today Flu shot today Pap today  Keep exercising

## 2015-02-26 ENCOUNTER — Telehealth: Payer: Self-pay

## 2015-02-26 LAB — HIV ANTIBODY (ROUTINE TESTING W REFLEX): HIV: NONREACTIVE

## 2015-02-26 LAB — CYTOLOGY - PAP

## 2015-02-26 NOTE — Telephone Encounter (Signed)
Pt cannot find copy of AVS on 02/25/15 and pt request copy faxed to [phone] (secure fax). Advised pt done.

## 2015-03-25 ENCOUNTER — Encounter: Payer: Self-pay | Admitting: Family Medicine

## 2015-03-27 ENCOUNTER — Ambulatory Visit (INDEPENDENT_AMBULATORY_CARE_PROVIDER_SITE_OTHER): Payer: BLUE CROSS/BLUE SHIELD | Admitting: Family Medicine

## 2015-03-27 ENCOUNTER — Encounter: Payer: Self-pay | Admitting: Family Medicine

## 2015-03-27 VITALS — BP 104/70 | HR 71 | Temp 99.1°F | Ht 66.0 in | Wt 167.2 lb

## 2015-03-27 DIAGNOSIS — L290 Pruritus ani: Secondary | ICD-10-CM | POA: Diagnosis not present

## 2015-03-27 NOTE — Progress Notes (Signed)
Pre visit review using our clinic review tool, if applicable. No additional management support is needed unless otherwise documented below in the visit note. 

## 2015-03-27 NOTE — Patient Instructions (Signed)
Change to a non scented baby wipe to clean after bowel movements - see if this is better or worse than the TUKS I do not see anything concerning on exam  Continue sarna if it helps and ask dermatology if they have other ideas for itching  Try taking a benadryl at bedtime -it will help sleep and itching  Stay cool , at end of shower rinse area with cold water

## 2015-03-27 NOTE — Progress Notes (Signed)
Subjective:    Patient ID: Rita Salas, female    DOB: May 31, 1985, 29 y.o.   MRN: [id number]  HPI Was dx with skin fold on anus (prev suspected skin tag)-pt saw dermatologist  Gave her sarna to help with itching  Given some different anti itch lotions -did not work as well   Now she feels like it is bigger and another place came up behind the first one Still itches in and around it  She is worried about what it is at this point   She cleans with tuks pads after BM   Itching worse at night   No abd pain or constipation or diarrhea   Patient Active Problem List   Diagnosis Date Noted  . 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 Screening for HIV (human immunodeficiency virus) 02/25/2015  . UTI (urinary tract infection) 03/09/2014  . Enlarged thyroid gland 02/19/2014  . Anal skin tag 01/09/2014  . Encounter for routine gynecological examination 11/16/2011  . Routine general medical examination at a health care facility 11/16/2011  . MOOD SWINGS 09/12/2009  . 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 ALLERGIC RHINITIS 11/10/2006   No past medical history on file. No past surgical history on file. Social History  Substance Use Topics  . Smoking status: Former Games developermoker  . Smokeless tobacco: None  . Alcohol Use: 0.0 oz/week    0 Standard drinks or equivalent per week     Comment: occassionally   No family history on file. No Known Allergies No current outpatient prescriptions on file prior to visit.   No current facility-administered medications on file prior to visit.    Review of Systems    Review of Systems  Constitutional: Negative for fever, appetite change, fatigue and unexpected weight change.  Eyes: Negative for pain and visual disturbance.  Respiratory: Negative for cough and shortness of breath.   Cardiovascular: Negative for cp or palpitations    Gastrointestinal: Negative for nausea, diarrhea and constipation.  Genitourinary: Negative for urgency and frequency.  Skin:  Negative for pallor or rash  pos for anal itching , neg for exp to pinworms Neurological: Negative for weakness, light-headedness, numbness and headaches.  Hematological: Negative for adenopathy. Does not bruise/bleed easily.  Psychiatric/Behavioral: Negative for dysphoric mood. The patient is not nervous/anxious.      Objective:   Physical Exam  Constitutional: She appears well-developed and well-nourished. No distress.  Well appearing   HENT:  Head: Normocephalic and atraumatic.  Eyes: Conjunctivae and EOM are normal. Pupils are equal, round, and reactive to light.  Neck: Normal range of motion. Neck supple.  Cardiovascular: Normal rate and regular rhythm.   Pulmonary/Chest: Effort normal and breath sounds normal.  Abdominal: Soft. Bowel sounds are normal. She exhibits no distension. There is no tenderness.  Genitourinary: Rectum normal. Rectal exam shows no external hemorrhoid, no fissure, no mass, no tenderness and anal tone normal.  No excoriation or rash noted   Lymphadenopathy:    She has no cervical adenopathy.  Neurological: She is alert.  Skin: No rash noted. No erythema.  Psychiatric: She has a normal mood and affect.          Assessment & Plan:   Problem List Items Addressed This Visit      Musculoskeletal and Integument   Pruritus ani - Primary    Ongoing Unremarkable exam  Change to a non scented baby wipe to clean after bowel movements - see if this is better or worse than the TUKS  I do not see anything concerning on exam  Continue sarna if it helps and ask dermatology if they have other ideas for itching  Try taking a benadryl at bedtime -it will help sleep and itching  Stay cool , at end of shower rinse area with cold water   Update if not starting to improve in a week or if worsening

## 2015-03-28 NOTE — Assessment & Plan Note (Signed)
Ongoing Unremarkable exam  Change to a non scented baby wipe to clean after bowel movements - see if this is better or worse than the TUKS I do not see anything concerning on exam  Continue sarna if it helps and ask dermatology if they have other ideas for itching  Try taking a benadryl at bedtime -it will help sleep and itching  Stay cool , at end of shower rinse area with cold water   Update if not starting to improve in a week or if worsening

## 2015-06-05 ENCOUNTER — Telehealth: Payer: Self-pay | Admitting: Family Medicine

## 2015-06-05 NOTE — Telephone Encounter (Signed)
[facility] Day - Client TELEPHONE ADVICE RECORD   [facility]    --------------------------------------------------------------------------------   Patient Name: MAIYA KATES  Gender: Female  DOB: 03-08-1986   Age: 30 Y 10 M 23 D  Return Phone Number: [phone] (Primary), [phone] (Secondary)  Address:     City/State/Zip: [place]  [address]   Client [facility] Day - Client  Client Site [facility] - Day  Physician [place], [place]   Contact Type Call  Call Type Triage / Clinical  Relationship To Patient Self  Appointment Disposition EMR Caller Not Reached  Info pasted into Epic Yes  Return Phone Number [phone] (Secondary)  Chief Complaint Mouth Symptoms  Initial Comment Caller states she is having tingling/twitch in lip.       Nurse Assessment       Guidelines          Guideline Title Affirmed Question Affirmed Notes Nurse Date/Time (Eastern Time)       Disp. Time Lamount Cohen Time) Disposition Final User    06/05/2015 4:34:27 PM Send To Clinical Follow Up Virgel Bouquet, RN, Deborah    06/05/2015 4:55:50 PM Send To Clinical Follow Up Zara Council, RN, Marylene Land    06/05/2015 4:59:15 PM Attempt made - no message left   Harlon Flor, RN, Darl Pikes    06/05/2015 5:00:40 PM Attempt made - no message left   Harlon Flor, RN, Darl Pikes      06/05/2015 5:46:22 PM FINAL ATTEMPT MADE - no message left Yes Harlon Flor, RN, Darl Pikes              After Care Instructions Given        Call Event Type User Date / Time Description

## 2015-06-06 NOTE — Telephone Encounter (Signed)
Pt has appt 06/07/15 at 3:15 with Dr Milinda Antis.

## 2015-06-06 NOTE — Telephone Encounter (Signed)
I will see her then  

## 2015-06-07 ENCOUNTER — Encounter: Payer: Self-pay | Admitting: Family Medicine

## 2015-06-07 ENCOUNTER — Ambulatory Visit (INDEPENDENT_AMBULATORY_CARE_PROVIDER_SITE_OTHER): Payer: BLUE CROSS/BLUE SHIELD | Admitting: Family Medicine

## 2015-06-07 VITALS — BP 126/74 | HR 70 | Temp 98.5°F | Ht 66.0 in | Wt 168.0 lb

## 2015-06-07 DIAGNOSIS — R200 Anesthesia of skin: Secondary | ICD-10-CM | POA: Insufficient documentation

## 2015-06-07 DIAGNOSIS — R202 Paresthesia of skin: Secondary | ICD-10-CM | POA: Insufficient documentation

## 2015-06-07 NOTE — Patient Instructions (Signed)
You may have some ulnar nerve inflammation at the wrist - from work and from wearing a hair band on your wrist  Stop wearing anything on the wrist  Change work hand or elbow position when able  Try a carpal tunnel wrist spint at night (can get at a drugstore)  An anti inflammatory like ibuprofen may also help as needed  Update if not improving   I think the lip tingling may be stress related or less likely -early cold sore or shingles  Watch for a rash/ let me know Watch for facial droop or weakness- keep [place] updated

## 2015-06-07 NOTE — Progress Notes (Signed)
Subjective:    Patient ID: Rita Salas, female    DOB: [birth year], 30 y.o.   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 Hand tingling 06/07/2015  . Numbness and tingling of left side of face 06/07/2015  . 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 Screening for HIV (human immunodeficiency virus) 02/25/2015  . UTI (urinary tract infection) 03/09/2014  . Enlarged thyroid gland 02/19/2014  . Anal skin tag 01/09/2014  . Encounter for routine gynecological examination 11/16/2011  . Routine general medical examination at a health care facility 11/16/2011  . MOOD SWINGS 09/12/2009  . 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 ALLERGIC RHINITIS 11/10/2006   No past medical history on file. No past surgical history on file. Social History  Substance Use Topics  . Smoking status: Former Games developer  . Smokeless tobacco: None  . Alcohol Use: 0.0 oz/week    0 Standard drinks or equivalent per week     Comment: occassionally   No family history on file. No Known Allergies No current outpatient prescriptions on file prior to visit.   No current facility-administered medications on file prior to visit.     Review of Systems Review of Systems  Constitutional: Negative for fever, appetite change, fatigue and  unexpected weight change.  Eyes: Negative for pain and visual disturbance.  Respiratory: Negative for cough and shortness of breath.   Cardiovascular: Negative for cp or palpitations    Gastrointestinal: Negative for nausea, diarrhea and constipation.  Genitourinary: Negative for urgency and frequency.  Skin: Negative for pallor or rash   MSK pos for r wrist discomfort  Neurological: Negative for weakness, light-headedness, and headaches.  Hematological: Negative for adenopathy. Does not bruise/bleed easily.  Psychiatric/Behavioral: Negative for dysphoric mood. The patient is not nervous/anxious.         Objective:   Physical Exam  Constitutional: She appears well-developed and well-nourished. No distress.  Well appearing   HENT:  Head: Normocephalic and atraumatic.  Right Ear: External ear normal.  Left Ear: External ear normal.  Nose: Nose normal.  Mouth/Throat: Oropharynx is clear and moist. No oropharyngeal exudate.  No facial droop noted  Eyes: Conjunctivae and EOM are normal. Pupils are equal, round, and reactive to light. Right eye exhibits no discharge. Left eye exhibits no discharge. No scleral icterus.  Neck: Normal range of motion. Neck supple. No JVD present. Carotid bruit is not present. No thyromegaly present.  Cardiovascular: Normal rate, regular rhythm and normal heart sounds.   No murmur heard. Pulmonary/Chest: Effort normal and breath sounds normal. No respiratory distress. She has no wheezes. She has  no rales.  Musculoskeletal: She exhibits no edema or tenderness.  Lymphadenopathy:    She has no cervical adenopathy.  Neurological: She is alert. She has normal reflexes. She displays no atrophy. No cranial nerve deficit or sensory deficit. She exhibits normal muscle tone. She displays a negative Romberg sign. Coordination and gait normal.  No focal neuro deficits   R hand - slightly pos phalen test for tingling in 4,5th fingers   Nl grip    Skin: Skin is warm  and dry. No rash noted. No erythema. No pallor.  Psychiatric: She has a normal mood and affect.          Assessment & Plan:   Problem List Items Addressed This Visit      Other   Hand tingling - Primary    In ulnar  nerve distribution  Reassuring exam Pt often wears a tight hair tie on her R wrist - this may cause problems Also reped motion  rec -stopping the hair tie  Use of carpal tunnel splint at night and use of nsaid prn  Changing ergonomics of work space if needed  Update if not starting to improve in several  weeks or if worsening        Numbness and tingling of left side of face    Lateral lower lip/ with occ twitching as well  Reassuring exam today  Suspect due to anxiety and stressors -pt agrees  Will continue to watch  No s/s of bell's palsy or shingles  Also doubt rel to hand tingling   If other areas develop-consider brain imaging to r/o MS

## 2015-06-07 NOTE — Progress Notes (Signed)
Pre visit review using our clinic review tool, if applicable. No additional management support is needed unless otherwise documented below in the visit note. 

## 2015-06-09 NOTE — Assessment & Plan Note (Signed)
In ulnar  nerve distribution  Reassuring exam Pt often wears a tight hair tie on her R wrist - this may cause problems Also reped motion  rec -stopping the hair tie  Use of carpal tunnel splint at night and use of nsaid prn  Changing ergonomics of work space if needed  Update if not starting to improve in several  weeks or if worsening

## 2015-06-09 NOTE — Assessment & Plan Note (Signed)
Lateral lower lip/ with occ twitching as well  Reassuring exam today  Suspect due to anxiety and stressors -pt agrees  Will continue to watch  No s/s of bell's palsy or shingles  Also doubt rel to hand tingling   If other areas develop-consider brain imaging to r/o MS

## 2015-11-04 ENCOUNTER — Ambulatory Visit (INDEPENDENT_AMBULATORY_CARE_PROVIDER_SITE_OTHER): Payer: BLUE CROSS/BLUE SHIELD | Admitting: Internal Medicine

## 2015-11-04 ENCOUNTER — Encounter: Payer: Self-pay | Admitting: Internal Medicine

## 2015-11-04 VITALS — BP 120/72 | HR 81 | Temp 98.2°F | Wt 169.0 lb

## 2015-11-04 DIAGNOSIS — L237 Allergic contact dermatitis due to plants, except food: Secondary | ICD-10-CM | POA: Diagnosis not present

## 2015-11-04 MED ORDER — TRIAMCINOLONE ACETONIDE 0.5 % EX OINT: 1.0000 "application " | TOPICAL_OINTMENT | Freq: Two times a day (BID) | CUTANEOUS | Status: DC

## 2015-11-04 NOTE — Progress Notes (Signed)
Subjective:    Patient ID: Rita Salas, female    DOB: Nov 16, 1985, 30 y.o.   MRN: [id number]  HPI  Pt presents to the clinic today with c/o a rash behind her left leg. She noticed this 1 week ago. The rash is itchy. It has not increased in size but she thinks it may have spread to her left upper arm. She noticed a red rash here 2 days ago. She has tried Ketoconazole cream, Hydrocortisone cream, Apple Cider Vinegar, Calamine and Alcohol with minimal relief. She has not come in contact with anything that she is allergic to. She has been outside. She also has a indoor/outdoor dog that could have gotten into something. She denies changes in soaps, lotions or detergents.  Review of Systems      No past medical history on file.  No current outpatient prescriptions on file.   No current facility-administered medications for this visit.    No Known Allergies  No family history on file.  Social History   Social History  . Marital Status: Married    Spouse Name: N/A  . Number of Children: N/A  . Years of Education: N/A   Occupational History  . Not on file.   Social History Main Topics  . Smoking status: Former Games developer  . Smokeless tobacco: Not on file  . Alcohol Use: 0.0 oz/week    0 Standard drinks or equivalent per week     Comment: occassionally  . Drug Use: No  . Sexual Activity: Not on file   Other Topics Concern  . Not on file   Social History Narrative     Constitutional: Denies fever, malaise, fatigue, headache or abrupt weight changes.  Skin: Pt reports rash. Denies ulcercations.   No other specific complaints in a complete review of systems (except as listed in HPI above).  Objective:   Physical Exam  BP 120/72 mmHg  Pulse 81  Temp(Src) 98.2 F (36.8 C) (Oral)  Wt 169 lb (76.658 kg)  SpO2 98%  LMP 10/25/2015 Wt Readings from Last 3 Encounters:  11/04/15 169 lb (76.658 kg)  06/07/15 168 lb (76.204 kg)  03/27/15 167 lb 4 oz (75.864 kg)     General: Appears her stated age, well developed, well nourished in NAD. Skin: Warm, dry and intact. Vesicular rash noted to left popliteal and left upper arm.   BMET    Component Value Date/Time   NA 139 02/25/2015 1016   K 3.9 02/25/2015 1016   CL 106 02/25/2015 1016   CO2 27 02/25/2015 1016   GLUCOSE 93 02/25/2015 1016   BUN 11 02/25/2015 1016   CREATININE 0.60 02/25/2015 1016   CALCIUM 9.5 02/25/2015 1016    Lipid Panel     Component Value Date/Time   CHOL 133 02/25/2015 1016   TRIG 57.0 02/25/2015 1016   HDL 49.00 02/25/2015 1016   CHOLHDL 3 02/25/2015 1016   VLDL 11.4 02/25/2015 1016   LDLCALC 73 02/25/2015 1016    CBC    Component Value Date/Time   WBC 7.4 02/25/2015 1016   RBC 4.47 02/25/2015 1016   HGB 13.2 02/25/2015 1016   HCT 39.7 02/25/2015 1016   PLT 247.0 02/25/2015 1016   MCV 88.7 02/25/2015 1016   MCHC 33.3 02/25/2015 1016   RDW 12.6 02/25/2015 1016   LYMPHSABS 1.7 02/25/2015 1016   MONOABS 0.6 02/25/2015 1016   EOSABS 0.1 02/25/2015 1016   BASOSABS 0.0 02/25/2015 1016    Hgb A1C No results  found for: HGBA1C       Assessment & Plan:   Poison Ivy:  eRx for Kenalog cream BID Benadryl at night for itching  RTC as needed or if symptoms persist or worsen BAITY, REGINA, NP

## 2015-11-04 NOTE — Patient Instructions (Signed)
Poison Ivy Poison ivy is a inflammation of the skin (contact dermatitis) caused by touching the allergens on the leaves of the ivy plant following previous exposure to the plant. The rash usually appears 48 hours after exposure. The rash is usually bumps (papules) or blisters (vesicles) in a linear pattern. Depending on your own sensitivity, the rash may simply cause redness and itching, or it may also progress to blisters which may break open. These must be well cared for to prevent secondary bacterial (germ) infection, followed by scarring. Keep any open areas dry, clean, dressed, and covered with an antibacterial ointment if needed. The eyes may also get puffy. The puffiness is worst in the morning and gets better as the day progresses. This dermatitis usually heals without scarring, within 2 to 3 weeks without treatment. HOME CARE INSTRUCTIONS  Thoroughly wash with soap and water as soon as you have been exposed to poison ivy. You have about one half hour to remove the plant resin before it will cause the rash. This washing will destroy the oil or antigen on the skin that is causing, or will cause, the rash. Be sure to wash under your fingernails as any plant resin there will continue to spread the rash. Do not rub skin vigorously when washing affected area. Poison ivy cannot spread if no oil from the plant remains on your body. A rash that has progressed to weeping sores will not spread the rash unless you have not washed thoroughly. It is also important to wash any clothes you have been wearing as these may carry active allergens. The rash will return if you wear the unwashed clothing, even several days later. Avoidance of the plant in the future is the best measure. Poison ivy plant can be recognized by the number of leaves. Generally, poison ivy has three leaves with flowering branches on a single stem. Diphenhydramine may be purchased over the counter and used as needed for itching. Do not drive with  this medication if it makes you drowsy.Ask your caregiver about medication for children. SEEK MEDICAL CARE IF:  Open sores develop.  Redness spreads beyond area of rash.  You notice purulent (pus-like) discharge.  You have increased pain.  Other signs of infection develop (such as fever).   This information is not intended to replace advice given to you by your health care Juliana Boling. Make sure you discuss any questions you have with your health care Latron Ribas.   Document Released: 05/01/2000 Document Revised: 07/27/2011 Document Reviewed: 10/10/2014 Elsevier Interactive Patient Education 2016 [employer]  

## 2015-11-04 NOTE — Progress Notes (Signed)
Pre visit review using our clinic review tool, if applicable. No additional management support is needed unless otherwise documented below in the visit note. 

## 2016-02-06 IMAGING — US THYROID ULTRASOUND
1 series · 14 of 25 positions shown · non-contrast
Comparison: None.

CLINICAL DATA: Enlarged thyroid gland by physical exam.

EXAM:
THYROID ULTRASOUND
TECHNIQUE: Ultrasound examination of the thyroid gland and adjacent soft
tissues was performed.

[Series 1: thyroid ultrasound · 0.08mm/px · 14 of 40 slices shown]
[im 1/40]
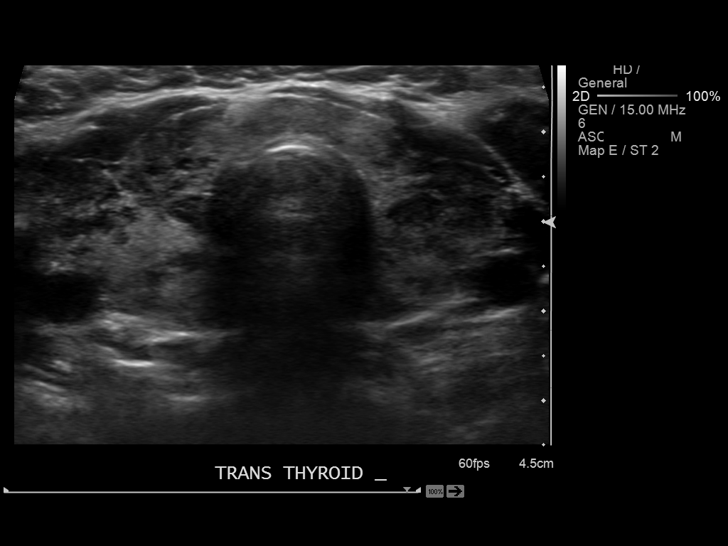
[im 4/40]
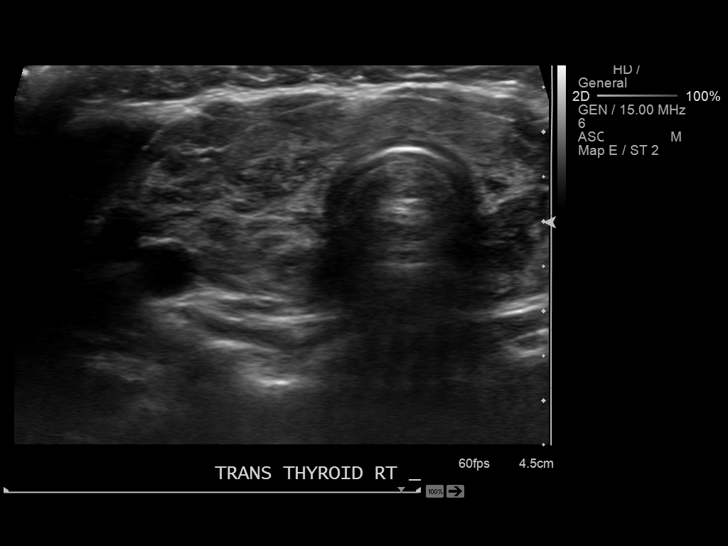
[im 7/40]
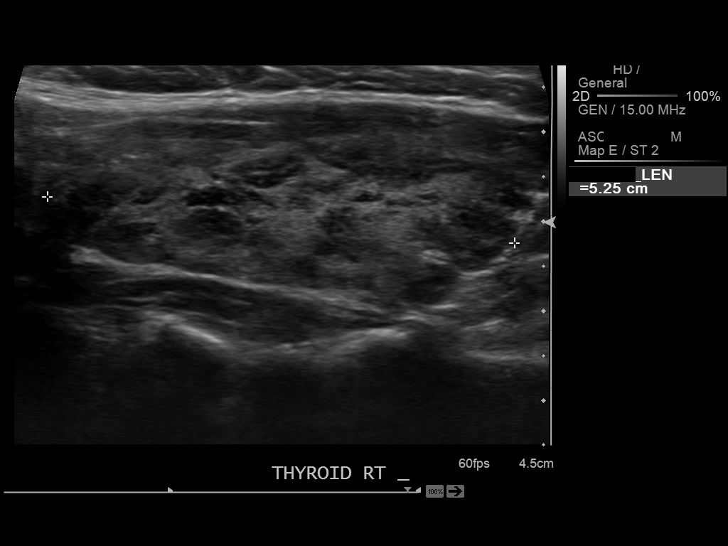
[im 10/40]
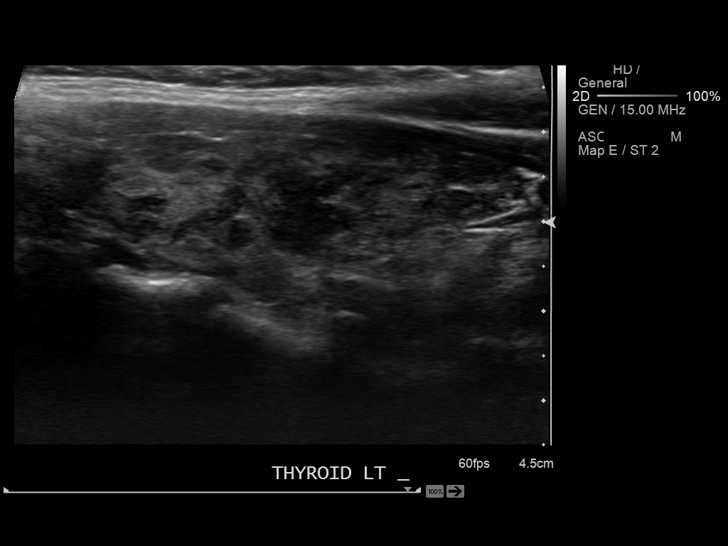
[im 14/40]
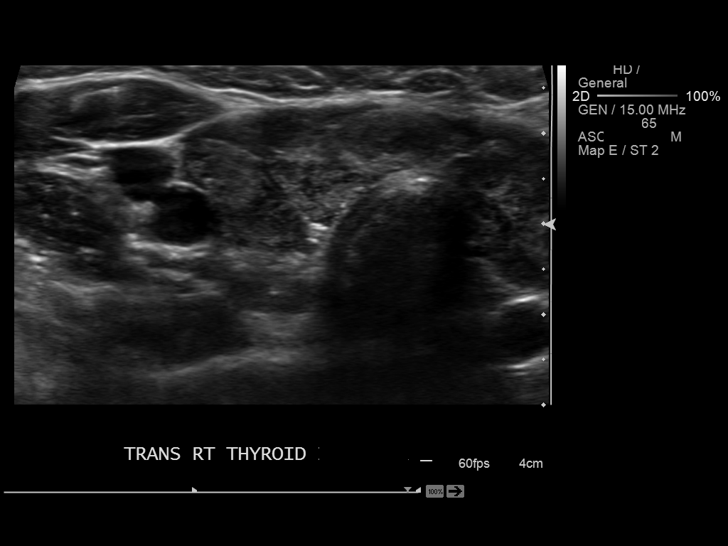
[im 15/40]
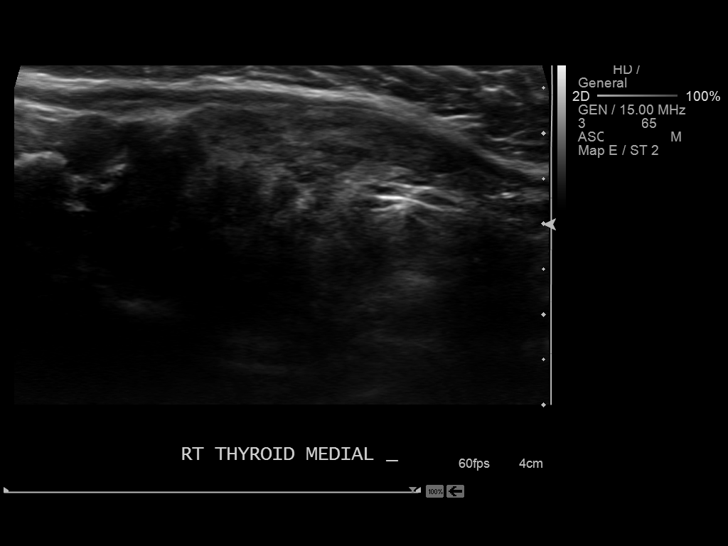
[im 18/40]
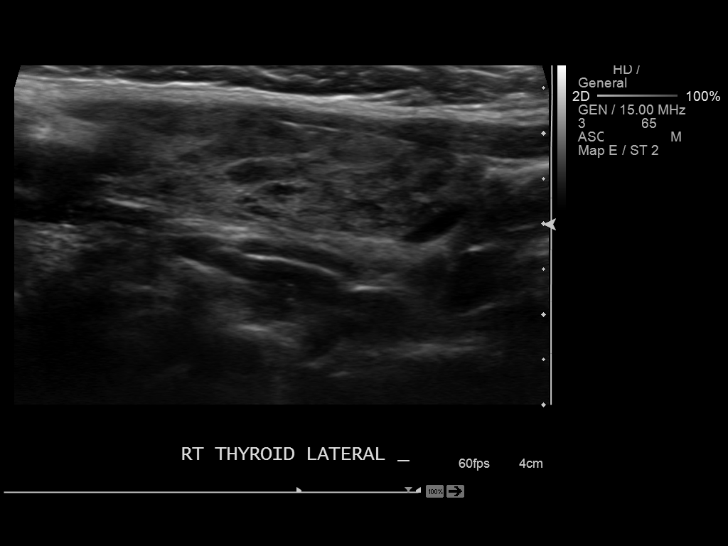
[im 22/40]
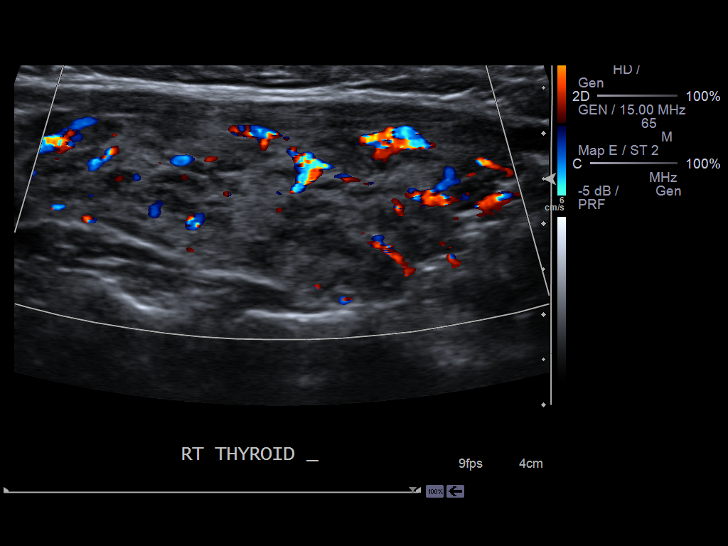
[im 25/40]
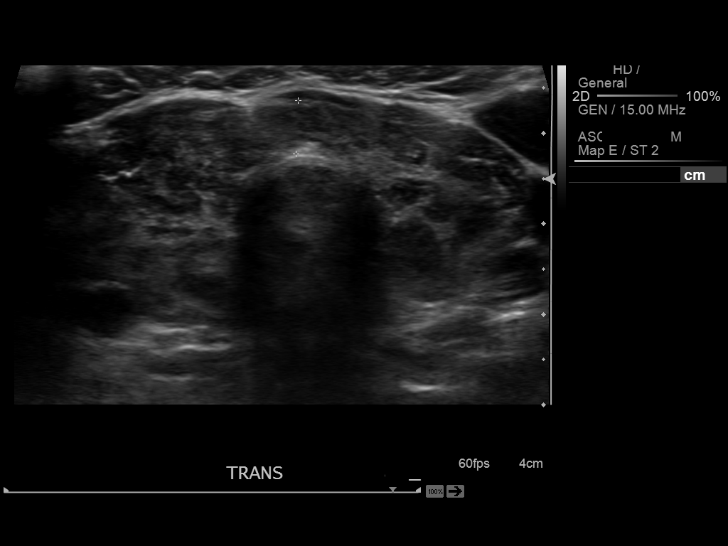
[im 27/40]
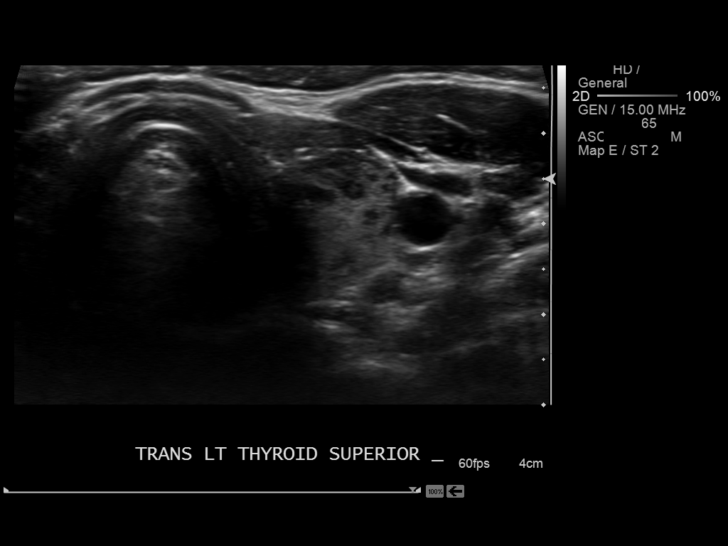
[im 30/40]
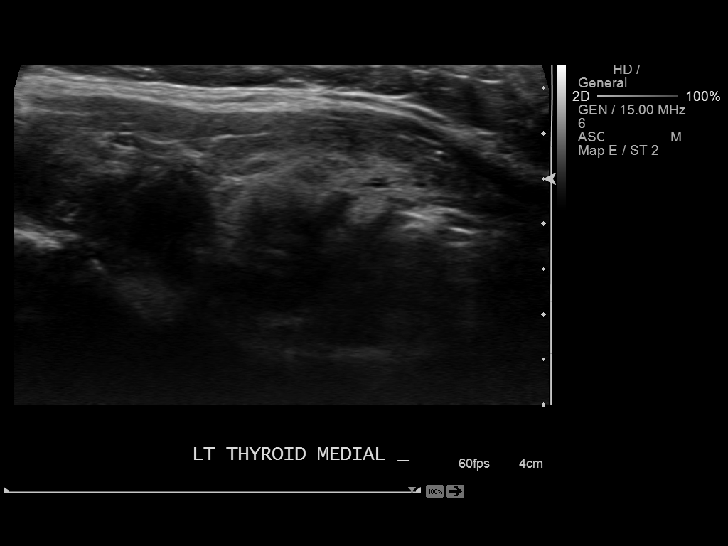
[im 33/40]
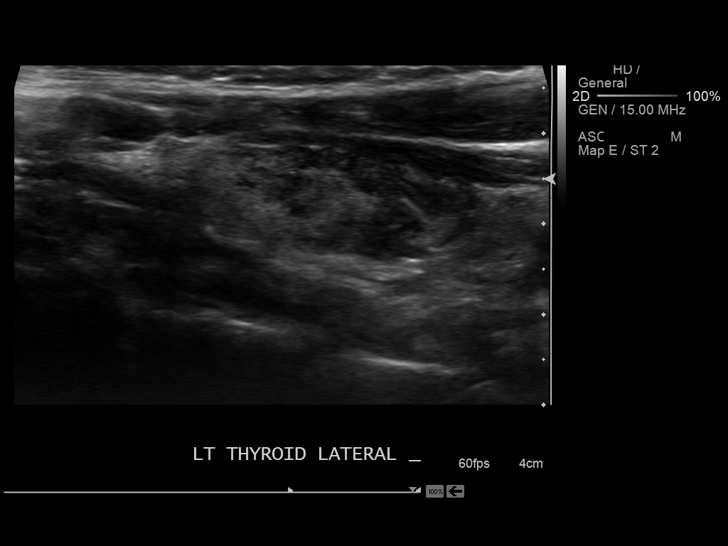
[im 36/40]
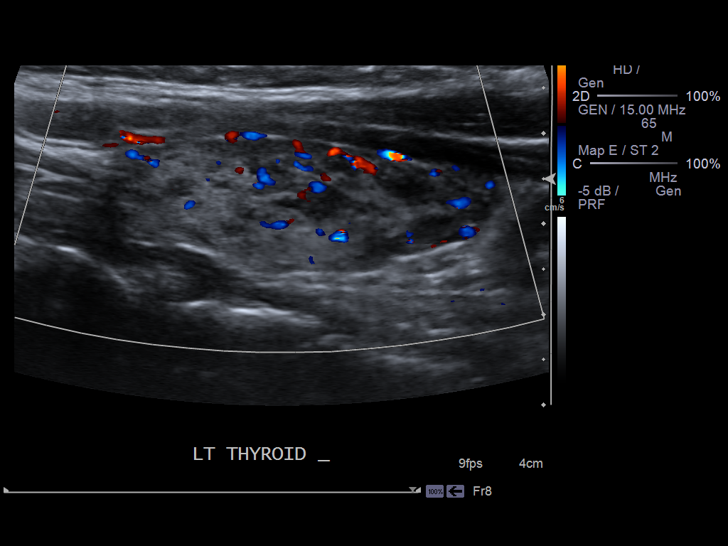
[im 40/40]
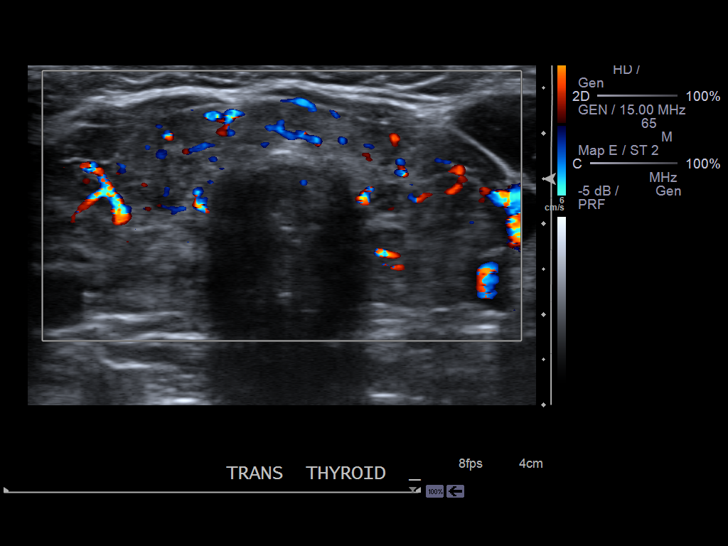

[14 of 25 positions shown; findings below may reference images not displayed]

FINDINGS: Right thyroid lobe

Measurements: 5.3 x 2.1 x 2.2 cm. Markedly heterogeneous gland
without focal nodule. Hyper vascularity throughout the gland.

Left thyroid lobe

Measurements: 5.4 x 2.0 x 1.8 cm. Heterogeneous gland without focal
nodule. Hypervascularity throughout the gland.

Isthmus

Thickness: 6 mm.  No nodules visualized.

Lymphadenopathy

None visualized.
IMPRESSION: Heterogeneous and hypervascular gland without focal nodule.

## 2016-02-24 ENCOUNTER — Other Ambulatory Visit: Payer: BLUE CROSS/BLUE SHIELD

## 2016-03-02 ENCOUNTER — Other Ambulatory Visit (HOSPITAL_COMMUNITY)
Admission: RE | Admit: 2016-03-02 | Discharge: 2016-03-02 | Disposition: A | Discharge status: Home / Self Care | Payer: BLUE CROSS/BLUE SHIELD | Source: Ambulatory Visit | Attending: Family Medicine | Admitting: Family Medicine

## 2016-03-02 ENCOUNTER — Encounter: Payer: Self-pay | Admitting: Family Medicine

## 2016-03-02 ENCOUNTER — Ambulatory Visit (INDEPENDENT_AMBULATORY_CARE_PROVIDER_SITE_OTHER): Payer: BLUE CROSS/BLUE SHIELD | Admitting: Family Medicine

## 2016-03-02 VITALS — BP 102/60 | HR 68 | Temp 98.4°F | Ht 66.0 in | Wt 168.0 lb

## 2016-03-02 DIAGNOSIS — Z01419 Encounter for gynecological examination (general) (routine) without abnormal findings: Secondary | ICD-10-CM | POA: Diagnosis not present

## 2016-03-02 DIAGNOSIS — Z0000 Encounter for general adult medical examination without abnormal findings: Secondary | ICD-10-CM

## 2016-03-02 DIAGNOSIS — E049 Nontoxic goiter, unspecified: Secondary | ICD-10-CM

## 2016-03-02 DIAGNOSIS — Z23 Encounter for immunization: Secondary | ICD-10-CM

## 2016-03-02 MED ORDER — NORETHIN ACE-ETH ESTRAD-FE 1-20 MG-MCG PO TABS: 1.0000 | ORAL_TABLET | Freq: Every day | ORAL | 11 refills | Status: DC

## 2016-03-02 NOTE — Assessment & Plan Note (Signed)
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

## 2016-03-02 NOTE — Assessment & Plan Note (Signed)
No change on exam today Rev last [place]

## 2016-03-02 NOTE — Patient Instructions (Addendum)
Take care of yourself  Get a nite guard to prevent teeth grinding Try to get regular exercise  Drink lots of water  Gyn exam today  Start on the oral contraceptive today and take at the same time every  Do not smoke  The pill does not protect you from STD- use condoms

## 2016-03-02 NOTE — Assessment & Plan Note (Signed)
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

## 2016-03-02 NOTE — Progress Notes (Signed)
Pre visit review using our clinic review tool, if applicable. No additional management support is needed unless otherwise documented below in the visit note. 

## 2016-03-02 NOTE — Progress Notes (Signed)
Subjective:    Patient ID: Rita Salas, female    DOB: May 22, 1985, 30 y.o.   MRN: [id number]  HPI Here for health maintenance exam and to review chronic medical problems    Doing ok  Working a lot  Bought a boat and camper this summer - has not used it too much yet   Wt Readings from Last 3 Encounters:  03/02/16 168 lb (76.2 kg)  11/04/15 169 lb (76.7 kg)  06/07/15 168 lb (76.2 kg)  bmi is 27.1 She is trying to loose wt- lost 6 lb and it came back on  Went on a cruise    Flu shot - getting today   Tetanus shot 7/09  Pap 10/16-neg with neg HPV Will do that today  No symptoms or problems  Does not desire std screening  She is interested in OC  She has been on ortho tri cyclen in the past- made her anxious /worse mood  Periods are short and not heavy  HIV screen neg 10/16   Family hx : PGM breast cancer  No heart dz    Lab Results  Component Value Date   CHOL 133 02/25/2015   HDL 49.00 02/25/2015   LDLCALC 73 02/25/2015   TRIG 57.0 02/25/2015   CHOLHDL 3 02/25/2015    Patient Active Problem List   Diagnosis Date Noted  . Hand tingling 06/07/2015  . Numbness and tingling of left side of face 06/07/2015  . 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 Screening for HIV (human immunodeficiency virus) 02/25/2015  . Enlarged thyroid gland 02/19/2014  . Anal skin tag 01/09/2014  . Encounter for routine gynecological examination 11/16/2011  . Routine general medical examination at a health care facility 11/16/2011  . MOOD SWINGS 09/12/2009  . 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 ALLERGIC RHINITIS 11/10/2006   No past medical history on file. No past surgical history on file. Social History  Substance Use Topics  . Smoking status: Former Games developer  . Smokeless tobacco: Never Used  . Alcohol use 0.0 oz/week     Comment: occassionally   Family History  Problem Relation Age of Onset  . Breast cancer Paternal Grandmother    No Known Allergies No current outpatient  prescriptions on file prior to visit.   No current facility-administered medications on file prior to visit.     Review of Systems Review of Systems  Constitutional: Negative for fever, appetite change, fatigue and unexpected weight change.  ENT pos for night time teeth grinding , neg for snoring  Eyes: Negative for pain and visual disturbance.  Respiratory: Negative for cough and shortness of breath.   Cardiovascular: Negative for cp or palpitations    Gastrointestinal: Negative for nausea, diarrhea and constipation.  Genitourinary: Negative for urgency and frequency.  Skin: Negative for pallor or rash   Neurological: Negative for weakness, light-headedness, numbness and headaches.  Hematological: Negative for adenopathy. Does not bruise/bleed easily.  Psychiatric/Behavioral: Negative for dysphoric mood. The patient is not nervous/anxious.         Objective:   Physical Exam  Constitutional: She appears well-developed and well-nourished. No distress.  overwt and well app  HENT:  Head: Normocephalic and atraumatic.  Right Ear: External ear normal.  Left Ear: External ear normal.  Nose: Nose normal.  Mouth/Throat: Oropharynx is clear and moist.  Eyes: Conjunctivae and EOM are normal. Pupils are equal, round, and reactive to light. Right eye exhibits no discharge. Left eye exhibits no discharge. No scleral icterus.  Neck: Normal range of motion. Neck supple. No JVD present. Carotid bruit is not present. Thyromegaly present.  Baseline prominent thyroid w/o change  Symmetric and nt   Cardiovascular: Normal rate, regular rhythm, normal heart sounds and intact distal pulses.  Exam reveals no gallop.   Pulmonary/Chest: Effort normal and breath sounds normal. No respiratory distress. She has no wheezes. She has no rales.  Abdominal: Soft. Bowel sounds are normal. She exhibits no distension and no mass. There is no tenderness.  Genitourinary:  Genitourinary Comments: Breast exam: No mass,  nodules, thickening, tenderness, bulging, retraction, inflamation, nipple discharge or skin changes noted.  No axillary or clavicular LA.             Anus appears normal w/o hemorrhoids or masses     External genitalia : nl appearance and hair distribution/no lesions     Urethral meatus : nl size, no lesions or prolapse     Urethra: no masses, tenderness or scarring    Bladder : no masses or tenderness     Vagina: nl general appearance, no discharge or  Lesions, no significant cystocele  or rectocele     Cervix: no lesions/ discharge or friability    Uterus: nl size, contour, position, and mobility (not fixed) , non tender    Adnexa : no masses, tenderness, enlargement or nodularity        Musculoskeletal: She exhibits no edema or tenderness.  Lymphadenopathy:    She has no cervical adenopathy.  Neurological: She is alert. She has normal reflexes. No cranial nerve deficit. She exhibits normal muscle tone. Coordination normal.  Skin: Skin is warm and dry. No rash noted. No erythema. No pallor.  Tanned with stable brown nevi  Psychiatric: She has a normal mood and affect.          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)  Will start OC - loestrin 1/20  She had mood swings with ortho tri cyclen in the past -so will try monophasic pill  Long discussion re: way to take OC properly and avoidance of smoking  Risks of blood clots outlined as well as possible side eff Pt aware that this does not prevent stds and condoms should still be used inst that it may take up to 3 months for menses to fall into rhythm or side eff to stop  Adv to call if problems or questions        Relevant Orders   Cytology - PAP   Routine general medical  examination at a health care facility - Primary    Reviewed health habits including diet and exercise and skin cancer prevention Reviewed appropriate screening tests for age  Also reviewed health mt list, fam hx and immunization status , as well as social and family history   See HPI Rev last years normal labs  Gyn exam done  Pap today  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

## 2016-03-03 LAB — CYTOLOGY - PAP: Diagnosis: NEGATIVE

## 2016-07-01 ENCOUNTER — Encounter: Payer: Self-pay | Admitting: Family Medicine

## 2016-07-01 ENCOUNTER — Ambulatory Visit (INDEPENDENT_AMBULATORY_CARE_PROVIDER_SITE_OTHER): Payer: BLUE CROSS/BLUE SHIELD | Admitting: Family Medicine

## 2016-07-01 VITALS — BP 106/70 | HR 73 | Temp 99.3°F | Ht 66.0 in | Wt 167.2 lb

## 2016-07-01 DIAGNOSIS — F341 Dysthymic disorder: Secondary | ICD-10-CM | POA: Diagnosis not present

## 2016-07-01 MED ORDER — BUPROPION HCL ER (XL) 150 MG PO TB24: 150.0000 mg | ORAL_TABLET | Freq: Every day | ORAL | 11 refills | Status: DC

## 2016-07-01 NOTE — Progress Notes (Signed)
Subjective:    Patient ID: Rita Salas, female    DOB: 06-22-1985, 31 y.o.   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 = is going to start looking for another job  2) financial - but not struggling to pay bills  3) low libido   Appetite is normal  Sleep - last few months wakes up in the middle of the night and cannot go back to sleep    Husband is pretty understanding   Getting exercise  Not outdoors in the winter  (likes to run when she can)   Family hx - mother may have had depression at some point in her life   Her biggest support is her husband  No marital issues   No mood issues as a kid  Happy childhood   Things that make her feel better- outdoors/ spending time with her dogs  Good family relationships   Patient Active Problem List   Diagnosis Date Noted  . Dysthymia 07/01/2016  . Hand tingling 06/07/2015  . Numbness and tingling of left side of face 06/07/2015  . 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 Screening for HIV (human immunodeficiency virus) 02/25/2015  . Enlarged thyroid gland 02/19/2014  . Anal skin tag 01/09/2014  . Encounter for routine gynecological examination 11/16/2011  . Routine general medical examination at a health care facility 11/16/2011  . 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 ALLERGIC RHINITIS 11/10/2006   No past  medical history on file. No past surgical history on file. Social History  Substance Use Topics  . Smoking status: Former Games developer  . Smokeless tobacco: Never Used  . Alcohol use 0.0 oz/week     Comment: occassionally   Family History  Problem Relation Age of Onset  . Breast cancer Paternal Grandmother    No Known Allergies Current Outpatient Prescriptions on File Prior to Visit  Medication Sig Dispense Refill  . norethindrone-ethinyl estradiol (JUNEL FE,GILDESS FE,LOESTRIN FE) 1-20 MG-MCG tablet Take 1 tablet by mouth daily. 1 Package 11   No current facility-administered medications on file prior to visit.     Review of Systems Review of Systems  Constitutional: Negative for fever, appetite change, fatigue and unexpected weight change.  Eyes: Negative for pain and visual disturbance.  Respiratory: Negative for cough and shortness of breath.   Cardiovascular: Negative for cp or palpitations    Gastrointestinal: Negative for nausea, diarrhea and constipation.  Genitourinary: Negative for urgency and frequency.  Skin: Negative for pallor or rash   Neurological: Negative for weakness, light-headedness, numbness and headaches.  Hematological: Negative for adenopathy. Does not bruise/bleed easily.  Psychiatric/Behavioral:pos for dysphoric mood with irritability , neg for SI          Objective:  Physical Exam  Constitutional: She appears well-developed and well-nourished. No distress.  Well appearing   HENT:  Head: Normocephalic and atraumatic.  Eyes: Conjunctivae and EOM are normal. Pupils are equal, round, and reactive to light.  Neck: Normal range of motion. Neck supple. No thyromegaly present.  Cardiovascular: Normal rate, regular rhythm and normal heart sounds.   Pulmonary/Chest: Effort normal and breath sounds normal. No respiratory distress.  Lymphadenopathy:    She has no cervical adenopathy.  Neurological: She is alert. She has normal reflexes. She displays no  tremor. No cranial nerve deficit. She exhibits normal muscle tone. Coordination normal.  Skin: Skin is warm and dry. No pallor.  Psychiatric: Her speech is normal and behavior is normal. Thought content normal. Her mood appears not anxious. Her affect is not blunt, not labile and not inappropriate. Thought content is not paranoid. She exhibits a depressed mood. She expresses no homicidal and no suicidal ideation.  Pleasant and attentive occ tearful when discussing her symptoms            Assessment & Plan:   Problem List Items Addressed This Visit      Other   Dysthymia    With some episodes of tearfulness/ no SI, and irritability Reviewed stressors/ coping techniques/symptoms/ support sources/ tx options and side effects in detail today Enc her to job search as her current job atmosphere is not good for her  Enc activities she likes and socialization  Continue exercise/outdoor time Good support Trial of wellbutrin xl 150 mg daily  Discussed expectations of this medication including time to effectiveness and mechanism of action, also poss of side effects (early and late)- including mental fuzziness, weight or appetite change, nausea and poss of worse dep or anxiety (even suicidal thoughts)  Pt voiced understanding and will stop med and update if this occurs   >25 minutes spent in face to face time with patient, >50% spent in counselling or coordination of care Fu in 4-6 weeks       Relevant Medications   buPROPion (WELLBUTRIN XL) 150 MG 24 hr tablet

## 2016-07-01 NOTE — Patient Instructions (Signed)
Keep talking to friends and family  Exercise  Get outside as much as you can  Try the wellbutrin xl 150 once daily  If any intolerable side effects - let us know  If you feel worse -stop it and let us know  If you want to see a counselor let us know  Do proceed with job search  Take care of yourself   Follow up with me in 4- 6 weeks

## 2016-07-01 NOTE — Assessment & Plan Note (Signed)
With some episodes of tearfulness/ no SI, and irritability Reviewed stressors/ coping techniques/symptoms/ support sources/ tx options and side effects in detail today Enc her to job search as her current job atmosphere is not good for her  Enc activities she likes and socialization  Continue exercise/outdoor time Good support Trial of wellbutrin xl 150 mg daily  Discussed expectations of this medication including time to effectiveness and mechanism of action, also poss of side effects (early and late)- including mental fuzziness, weight or appetite change, nausea and poss of worse dep or anxiety (even suicidal thoughts)  Pt voiced understanding and will stop med and update if this occurs   >25 minutes spent in face to face time with patient, >50% spent in counselling or coordination of care Fu in 4-6 weeks

## 2016-07-01 NOTE — Progress Notes (Signed)
Pre visit review using our clinic review tool, if applicable. No additional management support is needed unless otherwise documented below in the visit note. 

## 2016-07-24 ENCOUNTER — Encounter: Payer: Self-pay | Admitting: Family Medicine

## 2016-08-03 ENCOUNTER — Ambulatory Visit: Payer: BLUE CROSS/BLUE SHIELD | Admitting: Family Medicine

## 2016-08-07 ENCOUNTER — Ambulatory Visit (INDEPENDENT_AMBULATORY_CARE_PROVIDER_SITE_OTHER): Payer: BLUE CROSS/BLUE SHIELD | Admitting: Family Medicine

## 2016-08-07 ENCOUNTER — Encounter: Payer: Self-pay | Admitting: Family Medicine

## 2016-08-07 VITALS — BP 92/70 | HR 91 | Temp 98.9°F | Ht 66.0 in | Wt 162.0 lb

## 2016-08-07 DIAGNOSIS — B9789 Other viral agents as the cause of diseases classified elsewhere: Secondary | ICD-10-CM | POA: Diagnosis not present

## 2016-08-07 DIAGNOSIS — J069 Acute upper respiratory infection, unspecified: Secondary | ICD-10-CM | POA: Diagnosis not present

## 2016-08-07 DIAGNOSIS — F341 Dysthymic disorder: Secondary | ICD-10-CM | POA: Diagnosis not present

## 2016-08-07 NOTE — Progress Notes (Signed)
Pre visit review using our clinic review tool, if applicable. No additional management support is needed unless otherwise documented below in the visit note. 

## 2016-08-07 NOTE — Progress Notes (Signed)
Subjective    Patient ID: Rita DoffingCecilia W Dross, female    DOB: [birth year], 31 y.o.   MRN: [id number]  HPI Here for f/u of mood issues/dysthymia   Has ear pain and some dizziness today with a mild cough  R ear discomfort and just weird in her head since then  A little nasal congestion and chest congestion    Was started on wellbutrin xl last visit This had helped  Less down and not as easy to get put off   Stress level is still the same / but tolerating it better  Is looking for other employment  Has an interview coming up - exciting  Now feels motivated to get out of her rut   Her family noticed a difference  Lost her gm and uncle also since her last visit - she was tearful but dealt with it well  Both losses were expected   Getting some exercise when she can get outdoors   Taking care of herself for the most part     Wt Readings from Last 3 Encounters:  08/07/16 162 lb (73.5 kg)  07/01/16 167 lb 4 oz (75.9 kg)  03/02/16 168 lb (76.2 kg)  appetite is down a bit on wellbutrin  bmi 26.1  Perhaps some GI issues- she noticed - mild stomach irritation / random things do it  Thought it was caused by dairy  No stool changes tums knocks it out -she carries with her   Also started a pre biotic   Unsure if a side effect - is so random    Patient Active Problem List   Diagnosis Date Noted  . Viral URI with cough 08/07/2016  . Dysthymia 07/01/2016  . Hand tingling 06/07/2015  . Numbness and tingling of left side of face 06/07/2015  . 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 Screening for HIV (human immunodeficiency virus) 02/25/2015  . Enlarged thyroid gland 02/19/2014  . Anal skin tag 01/09/2014  . Encounter for routine gynecological examination 11/16/2011  . Routine general medical examination at a health care facility 11/16/2011  . 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 ALLERGIC RHINITIS 11/10/2006   No past medical history on file. No past surgical history on file. Social  History  Substance Use Topics  . Smoking status: Former Games developermoker  . Smokeless tobacco: Never Used  . Alcohol use 0.0 oz/week     Comment: occassionally   Family History  Problem Relation Age of Onset  . Breast cancer Paternal Grandmother    No Known Allergies Current Outpatient Prescriptions on File Prior to Visit  Medication Sig Dispense Refill  . buPROPion (WELLBUTRIN XL) 150 MG 24 hr tablet Take 1 tablet (150 mg total) by mouth daily. 30 tablet 11  . norethindrone-ethinyl estradiol (JUNEL FE,GILDESS FE,LOESTRIN FE) 1-20 MG-MCG tablet Take 1 tablet by mouth daily. 1 Package 11   No current facility-administered medications on file prior to visit.     Review of Systems Review of Systems  Constitutional: Negative for fever, appetite change, fatigue and unexpected weight change.  Eyes: Negative for pain and visual disturbance.  ENT pos for pnd with throat and ear discomfort/neg for facial pain  Respiratory: Negative for cough and shortness of breath.   Cardiovascular: Negative for cp or palpitations    Gastrointestinal: Negative for nausea, diarrhea and constipation.  Genitourinary: Negative for urgency and frequency.  Skin: Negative for pallor or rash   Neurological: Negative for weakness, light-headedness, numbness and headaches.  Hematological: Negative for adenopathy.  Does not bruise/bleed easily.  Psychiatric/Behavioral: pos for depressed mood that is much improved        Objective:   Physical Exam  Constitutional: She appears well-developed and well-nourished. No distress.  Well appearing   HENT:  Head: Normocephalic and atraumatic.  Right Ear: External ear normal.  Left Ear: External ear normal.  Mouth/Throat: Oropharynx is clear and moist.  Nares are injected and congested  No sinus tenderness Clear rhinorrhea and post nasal drip   Eyes: Conjunctivae and EOM are normal. Pupils are equal, round, and reactive to light. Right eye exhibits no discharge. Left eye  exhibits no discharge.  Neck: Normal range of motion. Neck supple. No JVD present. Carotid bruit is not present. No thyromegaly present.  Cardiovascular: Normal rate, regular rhythm, normal heart sounds and intact distal pulses.  Exam reveals no gallop.   Pulmonary/Chest: Effort normal and breath sounds normal. No respiratory distress. She has no wheezes. She has no rales. She exhibits no tenderness.  No crackles  Abdominal: Soft. Bowel sounds are normal. She exhibits no distension and no abdominal bruit. There is no tenderness.  Musculoskeletal: She exhibits no edema.  Lymphadenopathy:    She has no cervical adenopathy.  Neurological: She is alert. She has normal reflexes. No cranial nerve deficit.  Skin: Skin is warm and dry. No rash noted. No pallor.  Psychiatric: She has a normal mood and affect. Her speech is normal and behavior is normal. Thought content normal. Her mood appears not anxious. Her affect is not angry, not blunt, not labile and not inappropriate. Thought content is not paranoid. She does not exhibit a depressed mood. She expresses no homicidal and no suicidal ideation.  Improved affect  /bright  Talkative and pleasant  Disc stressors candidly          Assessment & Plan:   Problem List Items Addressed This Visit      Respiratory   Viral URI with cough    Overall mild Some ETD on R Disc symptomatic care - see instructions on AVS  Update if not starting to improve in a week or if worsening          Other   Dysthymia - Primary    Improved with wellbutrin Able to tolerate stressors / think clearly  Sadness is much imp as is energy level Reviewed stressors/ coping techniques/symptoms/ support sources/ tx options and side effects in detail today Will stick with this dose Enc exercise and outdoor time

## 2016-08-07 NOTE — Patient Instructions (Signed)
Continue wellbutrin  Take care of yourself  Get outdoor time and exercise when you can   For cold symptoms- mucinex can be helpful  Breathe steam  Drink lots of fluids Get extra rest

## 2016-08-09 NOTE — Assessment & Plan Note (Signed)
Improved with wellbutrin Able to tolerate stressors / think clearly  Sadness is much imp as is energy level Reviewed stressors/ coping techniques/symptoms/ support sources/ tx options and side effects in detail today Will stick with this dose Enc exercise and outdoor time

## 2016-08-09 NOTE — Assessment & Plan Note (Signed)
Overall mild Some ETD on R Disc symptomatic care - see instructions on AVS  Update if not starting to improve in a week or if worsening

## 2016-09-09 ENCOUNTER — Encounter: Payer: Self-pay | Admitting: Family Medicine

## 2016-09-10 ENCOUNTER — Encounter: Payer: Self-pay | Admitting: Family Medicine

## 2016-10-26 ENCOUNTER — Encounter: Payer: Self-pay | Admitting: Family Medicine

## 2016-10-27 NOTE — Telephone Encounter (Signed)
Called and addressed directly with pt, I am mailing her a copy of her immunizations off of [facility] which had her hep B series

## 2016-12-31 ENCOUNTER — Encounter: Payer: Self-pay | Admitting: Family Medicine

## 2016-12-31 MED ORDER — NORETHIN ACE-ETH ESTRAD-FE 1-20 MG-MCG PO TABS: 1.0000 | ORAL_TABLET | Freq: Every day | ORAL | 2 refills | Status: DC

## 2017-01-27 ENCOUNTER — Other Ambulatory Visit: Payer: Self-pay | Admitting: Family Medicine

## 2017-03-05 ENCOUNTER — Encounter: Payer: BLUE CROSS/BLUE SHIELD | Admitting: Family Medicine

## 2017-03-08 ENCOUNTER — Encounter: Payer: BLUE CROSS/BLUE SHIELD | Admitting: Family Medicine

## 2017-03-12 ENCOUNTER — Encounter: Payer: Self-pay | Admitting: Family Medicine

## 2017-03-12 ENCOUNTER — Other Ambulatory Visit (HOSPITAL_COMMUNITY)
Admission: RE | Admit: 2017-03-12 | Discharge: 2017-03-12 | Disposition: A | Discharge status: Home / Self Care | Payer: BLUE CROSS/BLUE SHIELD | Source: Ambulatory Visit | Attending: Family Medicine | Admitting: Family Medicine

## 2017-03-12 ENCOUNTER — Ambulatory Visit (INDEPENDENT_AMBULATORY_CARE_PROVIDER_SITE_OTHER): Payer: BLUE CROSS/BLUE SHIELD | Admitting: Family Medicine

## 2017-03-12 VITALS — BP 108/66 | HR 83 | Temp 98.1°F | Ht 66.0 in | Wt 161.8 lb

## 2017-03-12 DIAGNOSIS — F341 Dysthymic disorder: Secondary | ICD-10-CM | POA: Diagnosis not present

## 2017-03-12 DIAGNOSIS — Z87891 Personal history of nicotine dependence: Secondary | ICD-10-CM | POA: Insufficient documentation

## 2017-03-12 DIAGNOSIS — Z23 Encounter for immunization: Secondary | ICD-10-CM

## 2017-03-12 DIAGNOSIS — Z79899 Other long term (current) drug therapy: Secondary | ICD-10-CM | POA: Diagnosis not present

## 2017-03-12 DIAGNOSIS — Z01419 Encounter for gynecological examination (general) (routine) without abnormal findings: Secondary | ICD-10-CM

## 2017-03-12 DIAGNOSIS — E049 Nontoxic goiter, unspecified: Secondary | ICD-10-CM | POA: Insufficient documentation

## 2017-03-12 DIAGNOSIS — Z0000 Encounter for general adult medical examination without abnormal findings: Secondary | ICD-10-CM | POA: Diagnosis not present

## 2017-03-12 MED ORDER — NORETHIN ACE-ETH ESTRAD-FE 1-20 MG-MCG PO TABS: 1.0000 | ORAL_TABLET | Freq: Every day | ORAL | 3 refills | Status: DC

## 2017-03-12 MED ORDER — BUPROPION HCL ER (XL) 150 MG PO TB24: 150.0000 mg | ORAL_TABLET | Freq: Every day | ORAL | 3 refills | Status: DC

## 2017-03-12 NOTE — Patient Instructions (Signed)
Labs today   It is ok to skip your next placebo pills to try and skip menses on vacation   Keep up good diet and exercise   Flu shot today

## 2017-03-12 NOTE — Assessment & Plan Note (Signed)
Routine gyn exam with pap today  Remote hx of HPV  No symptoms On OC  Plans to skip next menses for vacation-disc skipping the placebo week this mo

## 2017-03-12 NOTE — Assessment & Plan Note (Signed)
Doing better with wellbutrin as well as new job Reviewed stressors/ coping techniques/symptoms/ support sources/ tx options and side effects in detail today  Enc good self care Refilled medication

## 2017-03-12 NOTE — Assessment & Plan Note (Signed)
No change on exam today  Will continue to follow

## 2017-03-12 NOTE — Progress Notes (Signed)
Subjective:    Patient ID: Rita Salas, female    DOB: 13-Oct-1985, 31 y.o.   MRN: [id number]  HPI  Here for health maintenance exam and to review chronic medical problems    Doing well  Working  Leaves next wkend for cruise   [employer]t Readings from Last 3 Encounters:  03/12/17 161 lb 12 oz (73.4 kg)  08/07/16 162 lb (73.5 kg)  07/01/16 167 lb 4 oz (75.9 kg)  wt is stable  26.11 kg/m   Flu shot - will get today   Eating healthy  Exercise - walking   Tetanus shot 7/09  Pap 10/17-neg  Remote past hx of HPV On OC  junel fe Menses - regular and last 1-2 days , if heavy - just for a brief time/ a few cramps recently  No plans to get pregnant  Pap due today   Due for labs -wellness  Last cholesterol   Lab Results  Component Value Date   CHOL 133 02/25/2015   HDL 49.00 02/25/2015   LDLCALC 73 02/25/2015   TRIG 57.0 02/25/2015   CHOLHDL 3 02/25/2015   wellbutrin for dysthymia  Doing better  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 Dysthymia 07/01/2016  . Hand tingling 06/07/2015  . Numbness and tingling of left side of face 06/07/2015  . Screening for HIV (human immunodeficiency virus) 02/25/2015  . Enlarged thyroid gland 02/19/2014  . Anal skin tag 01/09/2014  . Encounter for routine gynecological examination 11/16/2011  . Routine general medical examination at a health care facility 11/16/2011  . 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 ALLERGIC RHINITIS 11/10/2006   No past medical history on file. No past surgical history on file. Social History  Substance Use Topics  . Smoking status: Former Games developermoker  . Smokeless tobacco: Never Used  . Alcohol use 0.0 oz/week     Comment: occassionally   Family History  Problem Relation Age of Onset  . Breast cancer Paternal Grandmother    No Known Allergies No current outpatient prescriptions on file prior to visit.   No current  facility-administered medications on file prior to visit.     Review of Systems  Constitutional: Negative for activity change, appetite change, fatigue, fever and unexpected weight change.  HENT: Negative for congestion, ear pain, rhinorrhea, sinus pressure and sore throat.   Eyes: Negative for pain, redness and visual disturbance.  Respiratory: Negative for cough, shortness of breath and wheezing.   Cardiovascular: Negative for chest pain and palpitations.  Gastrointestinal: Negative for abdominal pain, blood in stool, constipation and diarrhea.  Endocrine: Negative for polydipsia and polyuria.  Genitourinary: Negative for dysuria, frequency and urgency.  Musculoskeletal: Negative for arthralgias, back pain and myalgias.  Skin: Negative for pallor and rash.  Allergic/Immunologic: Negative for environmental allergies.  Neurological: Negative for dizziness, syncope and headaches.  Hematological: Negative for adenopathy. Does not bruise/bleed easily.  Psychiatric/Behavioral: Negative for decreased concentration and dysphoric mood. The patient is not nervous/anxious.        Objective:   Physical Exam  Constitutional: She appears well-developed and well-nourished. No distress.  Well appearing  HENT:  Head: Normocephalic and atraumatic.  Right Ear: External ear normal.  Left Ear: External ear normal.  Mouth/Throat: Oropharynx is clear and moist.  Eyes: Pupils are equal, round, and reactive to light. Conjunctivae and EOM are normal. No scleral icterus.  Neck: Normal range of motion. Neck supple. No JVD present. Carotid bruit is not present. No thyromegaly present.  Prominent thyroid noted   Cardiovascular: Normal rate, regular rhythm, normal heart sounds and intact distal pulses.  Exam reveals no gallop.   Pulmonary/Chest: Effort normal and breath sounds normal. No respiratory distress. She has no wheezes. She exhibits no tenderness.  Abdominal: Soft. Bowel sounds are normal. She exhibits  no distension, no abdominal bruit and no mass. There is no tenderness.  Genitourinary: No breast swelling, tenderness, discharge or bleeding.  Genitourinary Comments: Breast exam: No mass, nodules, thickening, tenderness, bulging, retraction, inflamation, nipple discharge or skin changes noted.  No axillary or clavicular LA.             Anus appears normal w/o hemorrhoids or masses     External genitalia : nl appearance and hair distribution/no lesions     Urethral meatus : nl size, no lesions or prolapse     Urethra: no masses, tenderness or scarring    Bladder : no masses or tenderness     Vagina: nl general appearance, no discharge or  Lesions, no significant cystocele  or rectocele     Cervix: no lesions/ discharge or friability    Uterus: nl size, contour, position, and mobility (not fixed) , non tender    Adnexa : no masses, tenderness, enlargement or nodularity        Musculoskeletal: Normal range of motion. She exhibits no edema or tenderness.  Lymphadenopathy:    She has no cervical adenopathy.  Neurological: She is alert. She has normal reflexes. No cranial nerve deficit. She exhibits normal muscle tone. Coordination normal.  Skin: Skin is warm and dry. No rash noted. No erythema. No pallor.  Solar lentigines diffusely   Psychiatric: She has a normal mood and affect.          Assessment & Plan:   Problem List Items Addressed This Visit      Endocrine   Enlarged thyroid gland    No change on exam today  Will continue to follow         Other   Dysthymia    Doing better with wellbutrin as well as new job Reviewed stressors/ coping techniques/symptoms/ support sources/ tx options and side effects in detail today  Enc good self care Refilled medication       Relevant Medications   buPROPion (WELLBUTRIN XL) 150 MG 24 hr tablet   Encounter for routine gynecological examination    Routine gyn exam with pap today  Remote hx of HPV  No  symptoms On OC  Plans to skip next menses for vacation-disc skipping the placebo week this mo       Relevant Orders   Cytology - PAP   Routine general medical examination at a health care facility - Primary    Reviewed health habits including diet and exercise and skin cancer prevention Reviewed appropriate screening tests for age  Also reviewed health mt list, fam hx and immunization status , as well as social and family history   See HPI Labs for wellness today Gyn exam today  Flu shot today      Relevant Orders   CBC with Differential/Platelet (Completed)   Comprehensive metabolic panel (Completed)   Lipid panel (Completed)   TSH (Completed)    Other Visit Diagnoses    Need for influenza vaccination       Relevant Orders   Flu Vaccine QUAD 6+ mos PF IM (Fluarix Quad  PF) (Completed)

## 2017-03-13 LAB — CBC WITH DIFFERENTIAL/PLATELET
BASOS PCT: 0.3 %
Basophils Absolute: 17 cells/uL (ref 0–200)
EOS ABS: 58 {cells}/uL (ref 15–500)
Eosinophils Relative: 1 %
HEMATOCRIT: 37.1 % (ref 35.0–45.0)
HEMOGLOBIN: 12.8 g/dL (ref 11.7–15.5)
LYMPHS ABS: 1560 {cells}/uL (ref 850–3900)
MCH: 30.2 pg (ref 27.0–33.0)
MCHC: 34.5 g/dL (ref 32.0–36.0)
MCV: 87.5 fL (ref 80.0–100.0)
MPV: 11.3 fL (ref 7.5–12.5)
Monocytes Relative: 9.5 %
Neutro Abs: 3613 cells/uL (ref 1500–7800)
Neutrophils Relative %: 62.3 %
PLATELETS: 230 10*3/uL (ref 140–400)
RBC: 4.24 10*6/uL (ref 3.80–5.10)
RDW: 11.7 % (ref 11.0–15.0)
TOTAL LYMPHOCYTE: 26.9 %
WBC: 5.8 10*3/uL (ref 3.8–10.8)
WBCMIX: 551 {cells}/uL (ref 200–950)

## 2017-03-13 LAB — LIPID PANEL
CHOL/HDL RATIO: 3.4 (calc) (ref <5.0)
CHOLESTEROL: 155 mg/dL (ref <200)
HDL: 45 mg/dL — AB (ref >50)
LDL Cholesterol (Calc): 93 mg/dL (calc)
Non-HDL Cholesterol (Calc): 110 mg/dL (calc) (ref <130)
Triglycerides: 79 mg/dL (ref <150)

## 2017-03-13 LAB — COMPREHENSIVE METABOLIC PANEL
AG RATIO: 1.3 (calc) (ref 1.0–2.5)
ALBUMIN MSPROF: 4 g/dL (ref 3.6–5.1)
ALKALINE PHOSPHATASE (APISO): 63 U/L (ref 33–115)
ALT: 8 U/L (ref 6–29)
AST: 14 U/L (ref 10–30)
BILIRUBIN TOTAL: 0.3 mg/dL (ref 0.2–1.2)
BUN: 9 mg/dL (ref 7–25)
CALCIUM: 9.4 mg/dL (ref 8.6–10.2)
CO2: 25 mmol/L (ref 20–32)
Chloride: 106 mmol/L (ref 98–110)
Creat: 0.9 mg/dL (ref 0.50–1.10)
Globulin: 3 g/dL (calc) (ref 1.9–3.7)
Glucose, Bld: 82 mg/dL (ref 65–99)
POTASSIUM: 4.9 mmol/L (ref 3.5–5.3)
SODIUM: 139 mmol/L (ref 135–146)
TOTAL PROTEIN: 7 g/dL (ref 6.1–8.1)

## 2017-03-13 LAB — TSH: TSH: 1.69 mIU/L

## 2017-03-14 NOTE — Assessment & Plan Note (Signed)
Reviewed health habits including diet and exercise and skin cancer prevention Reviewed appropriate screening tests for age  Also reviewed health mt list, fam hx and immunization status , as well as social and family history   See HPI Labs for wellness today Gyn exam today  Flu shot today

## 2017-03-15 LAB — CYTOLOGY - PAP: DIAGNOSIS: NEGATIVE

## 2017-03-16 ENCOUNTER — Telehealth: Payer: Self-pay | Admitting: *Deleted

## 2017-03-16 MED ORDER — FLUCONAZOLE 150 MG PO TABS: 150.0000 mg | ORAL_TABLET | Freq: Once | ORAL | 0 refills | Status: AC

## 2017-03-16 NOTE — Telephone Encounter (Signed)
-----   Message from Judy PimpleMarne A Tower, MD sent at 03/15/2017  9:59 PM EDT ----- Yeast on pap Please call in diflucan 150 mg 1 po times one #1 no refill

## 2017-03-16 NOTE — Telephone Encounter (Signed)
Pt notified and Rx was sent ?

## 2017-06-02 ENCOUNTER — Encounter: Payer: Self-pay | Admitting: Family Medicine

## 2017-06-06 DIAGNOSIS — M549 Dorsalgia, unspecified: Secondary | ICD-10-CM | POA: Diagnosis not present

## 2017-06-06 DIAGNOSIS — M545 Low back pain: Secondary | ICD-10-CM | POA: Diagnosis not present

## 2017-10-18 ENCOUNTER — Telehealth: Payer: Self-pay

## 2017-10-18 NOTE — Telephone Encounter (Signed)
This is mychart note from pt; ----- Message -----  From: Rita Salas  Sent: 10/18/2017  2:30 PM  To: [facility]  Subject: Appointment scheduled from MyChart          Appointment For: Rita Salas ([id number])  Visit Type: MYCHART OFFICE VISIT (1064)    10/22/2017   3:45 PM 15 mins. Tower, Audrie GallusMarne A, MD    [facility]    Patient Comments:  Pressure in my chest. Possibly pulled something? Feel the pressure most when I bend/squat down    Problems remembering things. Forgetting where I put something, not remembering being told something. Having a hard time focusing and paying attention. Messing up at work because of it.   I spoke with pt; pt said pressure in chest comes a goes and is worse when bends or squats which started 1 week ago. Chest pressure last for a while but pt does not think heart issue. Memory issues have been going on for awhile and has previously mentioned to Dr Milinda Antisower. Pt does not want to change 30' appt on 10/22/17. Then pt said she might could come in late 10/19/17; pt checked with employer and scheduled 30' appt for 10/19/17 at 4:15. If pt condition worsens prior to appt pt will go to ED. 10/22/17 appt cancelled and FYI to Dr Milinda Antisower and [place] CMA.

## 2017-10-18 NOTE — Telephone Encounter (Signed)
I will see her then  

## 2017-10-19 ENCOUNTER — Ambulatory Visit: Payer: BLUE CROSS/BLUE SHIELD | Admitting: Family Medicine

## 2017-10-19 ENCOUNTER — Encounter: Payer: Self-pay | Admitting: Family Medicine

## 2017-10-19 ENCOUNTER — Ambulatory Visit (INDEPENDENT_AMBULATORY_CARE_PROVIDER_SITE_OTHER)
Admission: RE | Admit: 2017-10-19 | Discharge: 2017-10-19 | Disposition: A | Discharge status: Home / Self Care | Payer: BLUE CROSS/BLUE SHIELD | Source: Ambulatory Visit | Attending: Family Medicine | Admitting: Family Medicine

## 2017-10-19 VITALS — BP 110/78 | HR 86 | Temp 99.0°F | Ht 66.0 in | Wt 160.5 lb

## 2017-10-19 DIAGNOSIS — R079 Chest pain, unspecified: Secondary | ICD-10-CM | POA: Diagnosis not present

## 2017-10-19 DIAGNOSIS — F341 Dysthymic disorder: Secondary | ICD-10-CM

## 2017-10-19 DIAGNOSIS — R0789 Other chest pain: Secondary | ICD-10-CM | POA: Diagnosis not present

## 2017-10-19 DIAGNOSIS — R41840 Attention and concentration deficit: Secondary | ICD-10-CM | POA: Diagnosis not present

## 2017-10-19 NOTE — Assessment & Plan Note (Signed)
And memory  ? If poss side eff of wellbutrin (wants to come off this since mood is better) Will stop it and update in 2 wk if no imp  Consider neuro eval if no improvement /poss neuro psych testing

## 2017-10-19 NOTE — Patient Instructions (Signed)
I think you have musculoskeletal chest wall pain (muscle or cartilage)= likely from strain  Anti inflammatory medicine like advil or aleve can help /as can tylenol   Use heat for 5 minutes at a time Avoid heavy lifting /pushing/pulling   ekg looks good Let's do a chest xray today   Stop the wellbutrin - perhaps the concentration issues are a side effect  Also it is time to stop it  If any problems-let me know  If depression symptoms return let me know  If no improvement in memory/concentration in 2 weeks- let me know

## 2017-10-19 NOTE — Assessment & Plan Note (Signed)
Based on hx and exam with positional symptoms suspect this is MSK in origin  cxr pending ekg nl  Recommend analgesics and warm compress  Alert if not improving or if symptoms change or become exertional

## 2017-10-19 NOTE — Assessment & Plan Note (Signed)
Symptoms are better with new job/less stress She notices memory/concentration problems- this may be a side eff of wellbutrin so she will stop it Asked her to update me in 2 wk

## 2017-10-19 NOTE — Progress Notes (Signed)
Subjective:    Patient ID: Rita Salas, female    DOB: 10/09/1985, 32 y.o.   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   A little tender to the touch  Has happened with a deep breath once or twice   Not worse with exertion or inc in HR  Has not done much however   No sob or nausea or sweating   Also memory concerns   Wt Readings from Last 3 Encounters:  10/19/17 160 lb 8 oz (72.8 kg)  03/12/17 161 lb 12 oz (73.4 kg)  08/07/16 162 lb (73.5 kg)   25.91 kg/m   Temp is 99.0 today Not sick or coughing    EKG NSR with rate of 78 and no acute changes    Takes wellbutrin for dysthymia  Lab Results  Component Value Date   TSH 1.69 03/12/2017    Lab Results  Component Value Date   CREATININE 0.90 03/12/2017   BUN 9 03/12/2017   NA 139 03/12/2017   K 4.9 03/12/2017   CL 106 03/12/2017   CO2 25 03/12/2017   Lab Results  Component Value Date   WBC 5.8 03/12/2017   HGB 12.8 03/12/2017   HCT 37.1 03/12/2017   MCV 87.5 03/12/2017   PLT 230 03/12/2017     Memory and concentration problems - worse for the last year  Dental assistant  Some slip ups at work  Has forgotten song lyrics  Misplacing items lately  Trouble concentrating / remembering things she read  Of note - worse since starting wellbutrin and also starting a new job   Would like to stop wellbutin  Job is much much better   Never had concentration trouble as a kid/ did well    Patient Active  Problem List   Diagnosis Date Noted  . Chest wall pain 10/19/2017  . Poor concentration 10/19/2017  . Dysthymia 07/01/2016  . Hand tingling 06/07/2015  . Numbness and tingling of left side of face 06/07/2015  . Screening for HIV (human immunodeficiency virus) 02/25/2015  . Enlarged thyroid gland 02/19/2014  . Anal skin tag 01/09/2014  . Encounter for routine gynecological examination 11/16/2011  . Routine general medical examination at a health care facility 11/16/2011  . 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 ALLERGIC RHINITIS 11/10/2006   History reviewed. No pertinent past medical history. History reviewed. No pertinent surgical history. Social History   Tobacco Use  . Smoking status: Former Games developer  . Smokeless tobacco: Never Used  Substance Use Topics  . Alcohol use: Yes    Alcohol/week: 0.0 oz    Comment: occassionally  . Drug use: No   Family History  Problem Relation Age of Onset  . Breast cancer Paternal Grandmother    No Known Allergies Current Outpatient Medications on File Prior to Visit  Medication Sig Dispense Refill  . norethindrone-ethinyl estradiol (JUNEL FE 1/20) 1-20 MG-MCG tablet Take 1 tablet by mouth daily. 3 Package 3  No current facility-administered medications on file prior to visit.     Review of Systems  Constitutional: Negative for activity change, appetite change, fatigue, fever and unexpected weight change.  HENT: Negative for congestion, ear pain, rhinorrhea, sinus pressure and sore throat.   Eyes: Negative for pain, redness and visual disturbance.  Respiratory: Negative for cough, shortness of breath and wheezing.   Cardiovascular: Positive for chest pain. Negative for palpitations and leg swelling.  Gastrointestinal: Negative for abdominal pain, blood in stool, constipation and diarrhea.  Endocrine: Negative for polydipsia and polyuria.  Genitourinary: Negative for dysuria, frequency and urgency.  Musculoskeletal: Negative for  arthralgias, back pain and myalgias.  Skin: Negative for pallor and rash.  Allergic/Immunologic: Negative for environmental allergies.  Neurological: Negative for dizziness, syncope and headaches.  Hematological: Negative for adenopathy. Does not bruise/bleed easily.  Psychiatric/Behavioral: Positive for decreased concentration. Negative for confusion, dysphoric mood and sleep disturbance. The patient is not nervous/anxious.        Objective:   Physical Exam  Constitutional: She appears well-developed and well-nourished. No distress.  HENT:  Head: Normocephalic and atraumatic.  Mouth/Throat: Oropharynx is clear and moist.  Eyes: Pupils are equal, round, and reactive to light. Conjunctivae and EOM are normal.  Neck: Normal range of motion. Neck supple. No JVD present. Carotid bruit is not present. Thyromegaly present.  Cardiovascular: Normal rate, regular rhythm, normal heart sounds and intact distal pulses. Exam reveals no gallop.  Pulmonary/Chest: Effort normal and breath sounds normal. No stridor. No respiratory distress. She has no wheezes. She has no rales. She exhibits tenderness.  No crackles  Good air exch Mild ant /mid chest wall tenderness w/o crepitus or skin change   Abdominal: Soft. Bowel sounds are normal. She exhibits no distension, no abdominal bruit and no mass. There is no tenderness.  Musculoskeletal: She exhibits no edema, tenderness or deformity.  No tenderness/ swelling or palp cords in legs   Lymphadenopathy:    She has no cervical adenopathy.  Neurological: She is alert. She has normal reflexes. She displays normal reflexes. No cranial nerve deficit. She exhibits normal muscle tone. Coordination normal.  Skin: Skin is warm and dry. No rash noted. No erythema.  Psychiatric: She has a normal mood and affect.  pleasant          Assessment & Plan:   Problem List Items Addressed This Visit      Other   Chest wall pain - Primary    Based on hx and exam  with positional symptoms suspect this is MSK in origin  cxr pending ekg nl  Recommend analgesics and warm compress  Alert if not improving or if symptoms change or become exertional      Relevant Orders   DG Chest 2 View   Dysthymia    Symptoms are better with new job/less stress She notices memory/concentration problems- this may be a side eff of wellbutrin so she will stop it Asked her to update me in 2 wk       Poor concentration    And memory  ? If poss side eff of wellbutrin (wants to come off this since mood is better) Will stop it and update in 2 wk if no imp  Consider neuro eval if no improvement /poss neuro psych testing       Other Visit Diagnoses    Chest pain, unspecified type       Relevant Orders   EKG 12-Lead (Completed)

## 2017-10-22 ENCOUNTER — Ambulatory Visit: Payer: BLUE CROSS/BLUE SHIELD | Admitting: Family Medicine

## 2018-02-08 ENCOUNTER — Encounter: Payer: Self-pay | Admitting: Family Medicine

## 2018-02-10 ENCOUNTER — Encounter: Payer: Self-pay | Admitting: Family Medicine

## 2018-03-03 ENCOUNTER — Other Ambulatory Visit: Payer: Self-pay | Admitting: *Deleted

## 2018-03-03 MED ORDER — NORETHIN ACE-ETH ESTRAD-FE 1-20 MG-MCG PO TABS: 1.0000 | ORAL_TABLET | Freq: Every day | ORAL | 1 refills | Status: DC

## 2018-03-18 ENCOUNTER — Encounter: Payer: Self-pay | Admitting: Family Medicine

## 2018-03-18 ENCOUNTER — Ambulatory Visit (INDEPENDENT_AMBULATORY_CARE_PROVIDER_SITE_OTHER): Payer: BLUE CROSS/BLUE SHIELD | Admitting: Family Medicine

## 2018-03-18 VITALS — BP 128/78 | HR 66 | Temp 98.0°F | Ht 65.75 in | Wt 170.2 lb

## 2018-03-18 DIAGNOSIS — Z23 Encounter for immunization: Secondary | ICD-10-CM

## 2018-03-18 DIAGNOSIS — Z0000 Encounter for general adult medical examination without abnormal findings: Secondary | ICD-10-CM

## 2018-03-18 DIAGNOSIS — Z114 Encounter for screening for human immunodeficiency virus [HIV]: Secondary | ICD-10-CM

## 2018-03-18 DIAGNOSIS — H52223 Regular astigmatism, bilateral: Secondary | ICD-10-CM | POA: Diagnosis not present

## 2018-03-18 MED ORDER — NORETHIN ACE-ETH ESTRAD-FE 1-20 MG-MCG PO TABS: 1.0000 | ORAL_TABLET | Freq: Every day | ORAL | 0 refills | Status: DC

## 2018-03-18 NOTE — Progress Notes (Signed)
Subjective:    Patient ID: Rita Salas, female    DOB: Sep 16, 1985, 32 y.o.   MRN: [id number]  HPI  Here for health maintenance exam and to review chronic medical problems    Feeling good  Taking care of herself   Wt Readings from Last 3 Encounters:  03/18/18 170 lb 4 oz (77.2 kg)  10/19/17 160 lb 8 oz (72.8 kg)  03/12/17 161 lb 12 oz (73.4 kg)  exercise- walking (clearing land- dragging limb)  Took some barns down  Will build in the future (living in camper)  27.69 kg/m   Husband is a IT sales professional   Tetanus shot due   Flu shot - due for today   Pap 10/18 -neg  HPV neg 2016 OC- Junel fe (will stay on that) No plans d for pregnancy soon  Menses - short and not heavy/ no cramps  Very regular   Screening std - not desired except for HIV (needle stick in the past)  Monogamous No new partners   Eats healthy diet   Due for labs   Patient Active Problem List   Diagnosis Date Noted  . Chest wall pain 10/19/2017  . Poor concentration 10/19/2017  . Dysthymia 07/01/2016  . Hand tingling 06/07/2015  . Numbness and tingling of left side of face 06/07/2015  . Encounter for screening for HIV 02/25/2015  . Enlarged thyroid gland 02/19/2014  . Anal skin tag 01/09/2014  . Encounter for routine gynecological examination 11/16/2011  . Routine general medical examination at a health care facility 11/16/2011  . 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 ALLERGIC RHINITIS 11/10/2006   History reviewed. No pertinent past medical history. History reviewed. No pertinent surgical history. Social History   Tobacco Use  . Smoking status: Former Games developer  . Smokeless tobacco: Never Used  Substance Use Topics  . Alcohol use: Yes    Alcohol/week: 0.0 standard drinks    Comment: occassionally  . Drug use: No   Family History  Problem Relation Age of Onset  . Breast cancer Paternal Grandmother    No Known Allergies No current outpatient medications on file prior to visit.   No current  facility-administered medications on file prior to visit.      Review of Systems  Constitutional: Negative for activity change, appetite change, fatigue, fever and unexpected weight change.  HENT: Negative for congestion, ear pain, rhinorrhea, sinus pressure and sore throat.   Eyes: Negative for pain, redness and visual disturbance.  Respiratory: Negative for cough, shortness of breath and wheezing.   Cardiovascular: Negative for chest pain and palpitations.  Gastrointestinal: Negative for abdominal pain, blood in stool, constipation and diarrhea.  Endocrine: Negative for polydipsia and polyuria.  Genitourinary: Negative for dysuria, frequency and urgency.  Musculoskeletal: Negative for arthralgias, back pain and myalgias.  Skin: Negative for pallor and rash.  Allergic/Immunologic: Negative for environmental allergies.  Neurological: Negative for dizziness, syncope and headaches.  Hematological: Negative for adenopathy. Does not bruise/bleed easily.  Psychiatric/Behavioral: Negative for decreased concentration and dysphoric mood. The patient is not nervous/anxious.        Objective:   Physical Exam  Constitutional: She appears well-developed and well-nourished. No distress.  Well appearing   HENT:  Head: Normocephalic and atraumatic.  Right Ear: External ear normal.  Left Ear: External ear normal.  Mouth/Throat: Oropharynx is clear and moist.  Eyes: Pupils are equal, round, and reactive to light. Conjunctivae and EOM are normal. No scleral icterus.  Neck: Normal range of motion. Neck supple. No  JVD present. Carotid bruit is not present. No thyromegaly present.  Cardiovascular: Normal rate, regular rhythm, normal heart sounds and intact distal pulses. Exam reveals no gallop.  Pulmonary/Chest: Effort normal and breath sounds normal. No respiratory distress. She has no wheezes. She exhibits no tenderness. No breast tenderness, discharge or bleeding.  Abdominal: Soft. Bowel sounds are  normal. She exhibits no distension, no abdominal bruit and no mass. There is no tenderness.  Genitourinary: No breast tenderness, discharge or bleeding.  Genitourinary Comments: Breast exam: No mass, nodules, thickening, tenderness, bulging, retraction, inflamation, nipple discharge or skin changes noted.  No axillary or clavicular LA.      Musculoskeletal: Normal range of motion. She exhibits no edema or tenderness.  Lymphadenopathy:    She has no cervical adenopathy.  Neurological: She is alert. She has normal reflexes. She displays normal reflexes. No cranial nerve deficit. She exhibits normal muscle tone. Coordination normal.  Skin: Skin is warm and dry. No rash noted. No erythema. No pallor.  Lentigines and stable brown nevi  Psychiatric: She has a normal mood and affect.  Pleasant           Assessment & Plan:   Problem List Items Addressed This Visit      Other   Encounter for screening for HIV    Needle stick in remote past  Neg HIV screen so far Repeat today      Relevant Orders   HIV Antibody (routine testing w rflx) (Completed)   Routine general medical examination at a health care facility - Primary    Reviewed health habits including diet and exercise and skin cancer prevention Reviewed appropriate screening tests for age  Also reviewed health mt list, fam hx and immunization status , as well as social and family history   See HPI Tdap and flu vaccines today  Nl /reassuring exam  Lab today Enc exercise and healthy diet       Relevant Orders   CBC with Differential/Platelet (Completed)   Comprehensive metabolic panel (Completed)   Lipid panel (Completed)   TSH (Completed)   Flu Vaccine QUAD 6+ mos PF IM (Fluarix Quad PF) (Completed)   Tdap vaccine greater than or equal to 7yo IM (Completed)    Other Visit Diagnoses    Need for influenza vaccination       Relevant Orders   Flu Vaccine QUAD 6+ mos PF IM (Fluarix Quad PF) (Completed)   Need for Tdap  vaccination       Relevant Orders   Tdap vaccine greater than or equal to 7yo IM (Completed)

## 2018-03-18 NOTE — Patient Instructions (Signed)
Labs today including HIV screening   Tdap vaccine  Flu vaccine   Take care of yourself  Continue staying active and eat a healthy diet

## 2018-03-19 LAB — TSH: TSH: 1.32 m[IU]/L

## 2018-03-19 LAB — CBC WITH DIFFERENTIAL/PLATELET
BASOS PCT: 0.1 %
Basophils Absolute: 7 cells/uL (ref 0–200)
EOS ABS: 71 {cells}/uL (ref 15–500)
EOS PCT: 1 %
HCT: 39.2 % (ref 35.0–45.0)
Hemoglobin: 13.2 g/dL (ref 11.7–15.5)
Lymphs Abs: 1654 cells/uL (ref 850–3900)
MCH: 29.9 pg (ref 27.0–33.0)
MCHC: 33.7 g/dL (ref 32.0–36.0)
MCV: 88.9 fL (ref 80.0–100.0)
MONOS PCT: 9.2 %
MPV: 11 fL (ref 7.5–12.5)
NEUTROS ABS: 4714 {cells}/uL (ref 1500–7800)
Neutrophils Relative %: 66.4 %
PLATELETS: 274 10*3/uL (ref 140–400)
RBC: 4.41 10*6/uL (ref 3.80–5.10)
RDW: 11.5 % (ref 11.0–15.0)
TOTAL LYMPHOCYTE: 23.3 %
WBC mixed population: 653 cells/uL (ref 200–950)
WBC: 7.1 10*3/uL (ref 3.8–10.8)

## 2018-03-19 LAB — LIPID PANEL
CHOL/HDL RATIO: 3.3 (calc) (ref <5.0)
CHOLESTEROL: 157 mg/dL (ref <200)
HDL: 47 mg/dL — ABNORMAL LOW (ref >50)
LDL Cholesterol (Calc): 89 mg/dL (calc)
Non-HDL Cholesterol (Calc): 110 mg/dL (calc) (ref <130)
Triglycerides: 118 mg/dL (ref <150)

## 2018-03-19 LAB — HIV ANTIBODY (ROUTINE TESTING W REFLEX): HIV: NONREACTIVE

## 2018-03-19 LAB — COMPREHENSIVE METABOLIC PANEL
AG Ratio: 1.4 (calc) (ref 1.0–2.5)
ALKALINE PHOSPHATASE (APISO): 75 U/L (ref 33–115)
ALT: 9 U/L (ref 6–29)
AST: 16 U/L (ref 10–30)
Albumin: 4.1 g/dL (ref 3.6–5.1)
BILIRUBIN TOTAL: 0.3 mg/dL (ref 0.2–1.2)
BUN: 13 mg/dL (ref 7–25)
CALCIUM: 9.9 mg/dL (ref 8.6–10.2)
CO2: 28 mmol/L (ref 20–32)
Chloride: 104 mmol/L (ref 98–110)
Creat: 0.88 mg/dL (ref 0.50–1.10)
Globulin: 3 g/dL (calc) (ref 1.9–3.7)
Glucose, Bld: 80 mg/dL (ref 65–99)
Potassium: 4.2 mmol/L (ref 3.5–5.3)
Sodium: 138 mmol/L (ref 135–146)
Total Protein: 7.1 g/dL (ref 6.1–8.1)

## 2018-03-20 NOTE — Assessment & Plan Note (Signed)
Needle stick in remote past  Neg HIV screen so far Repeat today

## 2018-03-20 NOTE — Assessment & Plan Note (Signed)
Reviewed health habits including diet and exercise and skin cancer prevention Reviewed appropriate screening tests for age  Also reviewed health mt list, fam hx and immunization status , as well as social and family history   See HPI Tdap and flu vaccines today  Nl /reassuring exam  Lab today Enc exercise and healthy diet

## 2018-06-08 ENCOUNTER — Encounter: Payer: Self-pay | Admitting: Family Medicine

## 2018-06-08 ENCOUNTER — Ambulatory Visit: Payer: BLUE CROSS/BLUE SHIELD | Admitting: Family Medicine

## 2018-06-08 ENCOUNTER — Other Ambulatory Visit: Payer: Self-pay | Admitting: Family Medicine

## 2018-06-08 VITALS — BP 120/76 | HR 92 | Temp 99.0°F | Ht 65.75 in | Wt 171.5 lb

## 2018-06-08 DIAGNOSIS — J040 Acute laryngitis: Secondary | ICD-10-CM | POA: Insufficient documentation

## 2018-06-08 MED ORDER — NORETHIN ACE-ETH ESTRAD-FE 1-20 MG-MCG PO TABS: 1.0000 | ORAL_TABLET | Freq: Every day | ORAL | 0 refills | Status: DC

## 2018-06-08 NOTE — Progress Notes (Signed)
BP 120/76 (BP Location: Right Arm, Patient Position: Sitting, Cuff Size: Normal)   Pulse 92   Temp 99 F (37.2 C) (Oral)   Ht 5' 5.75" (1.67 m)   Wt 171 lb 8 oz (77.8 kg)   LMP 06/07/2018   SpO2 99%   BMI 27.89 kg/m    CC: sore throat Subjective:    Patient ID: Rita Salas, female    DOB: January 20, 1986, 33 y.o.   MRN: [id number]  HPI: Rita Salas is a 33 y.o. female presenting on 06/08/2018 for Sore Throat (C/o sore throat, hoarseness, bilateral ear achiness, cough, nasal congestion and drainage. Sxs started 06/04/18. States she no longer has sore thoat. Tried Alever, Alka Seltzer, Nyquil and Mucinex. )   4d h/o ST, earaches, hoarse voice, cough, nasal congestion and drainage. Sore throat feeling better. Cough progressively worsening, productive of some mucous. Initial feverish.   No fevers/chills, dyspnea or wheezing. No abd pain or nausea.   Has tried aleve with benefit, alka seltzer, nyquil, mucinex without much relief.   No sick contacts at home.  No h/o asthma Non smoker     Relevant past medical, surgical, family and social history reviewed and updated as indicated. Interim medical history since our last visit reviewed. Allergies and medications reviewed and updated. Outpatient Medications Prior to Visit  Medication Sig Dispense Refill  . norethindrone-ethinyl estradiol (JUNEL FE 1/20) 1-20 MG-MCG tablet Take 1 tablet by mouth daily. 3 Package 0   No facility-administered medications prior to visit.      Per HPI unless specifically indicated in ROS section below Review of Systems Objective:    BP 120/76 (BP Location: Right Arm, Patient Position: Sitting, Cuff Size: Normal)   Pulse 92   Temp 99 F (37.2 C) (Oral)   Ht 5' 5.75" (1.67 m)   Wt 171 lb 8 oz (77.8 kg)   LMP 06/07/2018   SpO2 99%   BMI 27.89 kg/m   Wt Readings from Last 3 Encounters:  06/08/18 171 lb 8 oz (77.8 kg)  03/18/18 170 lb 4 oz (77.2 kg)  10/19/17 160 lb 8 oz (72.8 kg)      Physical Exam Vitals signs and nursing note reviewed.  Constitutional:      General: She is not in acute distress.    Appearance: Normal appearance. She is well-developed.     Comments: Hoarse voice  HENT:     Head: Normocephalic and atraumatic.     Right Ear: Hearing, tympanic membrane, ear canal and external ear normal.     Left Ear: Hearing, tympanic membrane, ear canal and external ear normal.     Nose: Mucosal edema, congestion and rhinorrhea present.     Right Sinus: No maxillary sinus tenderness or frontal sinus tenderness.     Left Sinus: No maxillary sinus tenderness or frontal sinus tenderness.     Mouth/Throat:     Pharynx: Uvula midline. Posterior oropharyngeal erythema present. No oropharyngeal exudate.     Tonsils: No tonsillar abscesses.  Eyes:     General: No scleral icterus.    Conjunctiva/sclera: Conjunctivae normal.     Pupils: Pupils are equal, round, and reactive to light.  Neck:     Musculoskeletal: Normal range of motion and neck supple.  Cardiovascular:     Rate and Rhythm: Normal rate and regular rhythm.     Heart sounds: Normal heart sounds. No murmur.  Pulmonary:     Effort: Pulmonary effort is normal. No respiratory distress.  Breath sounds: Normal breath sounds. No wheezing or rales.     Comments: Lungs clear Lymphadenopathy:     Cervical: No cervical adenopathy.  Skin:    General: Skin is warm and dry.     Findings: No rash.  Neurological:     Mental Status: She is alert.       Assessment & Plan:  OCP refilled to [facility] per pt request.  Problem List Items Addressed This Visit    Acute laryngitis - Primary    Anticipate viral given short duration and improvement noted to date. Supportive care reviewed as per instructions. Update if not improving as expected. Pt agrees with plan.           Meds ordered this encounter  Medications  . norethindrone-ethinyl estradiol (JUNEL FE 1/20) 1-20 MG-MCG tablet    Sig: Take 1 tablet by mouth  daily.    Dispense:  3 Package    Refill:  0   No orders of the defined types were placed in this encounter.  Patient Instructions  You have a viral upper respiratory infection. Antibiotics are not needed for this.  Viral infections usually take 7-10 days to resolve.  The cough can last a few weeks to go away. Try delsym or dimetapp for cough suppression as needed Continue twice a day aleve with meals.  Push fluids and plenty of rest. Please return if you are not improving as expected, or if you have high fevers (>101.5) or difficulty swallowing or worsening productive cough. Call clinic with questions.  Good to see you today. I hope you start feeling better soon.    Follow up plan: No follow-ups on file.  Eustaquio BoydenJavier Jayshawn Colston, MD

## 2018-06-08 NOTE — Assessment & Plan Note (Signed)
Anticipate viral given short duration and improvement noted to date. Supportive care reviewed as per instructions. Update if not improving as expected. Pt agrees with plan.

## 2018-06-08 NOTE — Patient Instructions (Signed)
You have a viral upper respiratory infection. Antibiotics are not needed for this.  Viral infections usually take 7-10 days to resolve.  The cough can last a few weeks to go away. Try delsym or dimetapp for cough suppression as needed Continue twice a day aleve with meals.  Push fluids and plenty of rest. Please return if you are not improving as expected, or if you have high fevers (>101.5) or difficulty swallowing or worsening productive cough. Call clinic with questions.  Good to see you today. I hope you start feeling better soon.

## 2018-07-21 ENCOUNTER — Encounter: Payer: Self-pay | Admitting: Family Medicine

## 2018-08-25 ENCOUNTER — Other Ambulatory Visit: Payer: Self-pay | Admitting: Family Medicine

## 2019-02-07 ENCOUNTER — Other Ambulatory Visit: Payer: Self-pay | Admitting: Family Medicine

## 2019-03-24 ENCOUNTER — Other Ambulatory Visit: Payer: Self-pay

## 2019-03-24 ENCOUNTER — Ambulatory Visit (INDEPENDENT_AMBULATORY_CARE_PROVIDER_SITE_OTHER): Payer: BC Managed Care – PPO | Admitting: Family Medicine

## 2019-03-24 ENCOUNTER — Encounter: Payer: Self-pay | Admitting: Family Medicine

## 2019-03-24 VITALS — BP 118/74 | HR 87 | Temp 98.1°F | Ht 66.0 in | Wt 168.0 lb

## 2019-03-24 DIAGNOSIS — Z309 Encounter for contraceptive management, unspecified: Secondary | ICD-10-CM | POA: Insufficient documentation

## 2019-03-24 DIAGNOSIS — Z23 Encounter for immunization: Secondary | ICD-10-CM | POA: Diagnosis not present

## 2019-03-24 DIAGNOSIS — N926 Irregular menstruation, unspecified: Secondary | ICD-10-CM | POA: Diagnosis not present

## 2019-03-24 DIAGNOSIS — Z0000 Encounter for general adult medical examination without abnormal findings: Secondary | ICD-10-CM | POA: Diagnosis not present

## 2019-03-24 DIAGNOSIS — Z3009 Encounter for other general counseling and advice on contraception: Secondary | ICD-10-CM | POA: Diagnosis not present

## 2019-03-24 LAB — POCT URINE PREGNANCY: Preg Test, Ur: NEGATIVE

## 2019-03-24 NOTE — Progress Notes (Signed)
Subjective:    Patient ID: Rita Salas, female    DOB: Nov 02, 1985, 33 y.o.   MRN: [id number]  HPI Here for health maintenance exam and to review chronic medical problems   Doing ok overall  Still working outside the home since may  No covid contacts     Wt Readings from Last 3 Encounters:  03/24/19 168 lb (76.2 kg)  06/08/18 171 lb 8 oz (77.8 kg)  03/18/18 170 lb 4 oz (77.2 kg)  walking for exercise  Diet is pretty healthy  27.12 kg/m   Flu vaccine -needs flu shot   Pap 10/18 -negative  HPV neg in 2016  Stopped her OC - body is adjusting   Does not need STD screening    She had spotting but no menses since stopping her OC   Neg urine preg today Results for orders placed or performed in visit on 03/24/19  POCT urine pregnancy  Result Value Ref Range   Preg Test, Ur Negative Negative     Has been on OC for a long time  Is interested in a copper iud  Wants to get away from hormonal contraception  Had talked about a vasectomy   Does not plan on pregnancy currently   Tdap 11/19   HIv screen neg 11/19   Did labs a year ago Lab Results  Component Value Date   CHOL 157 03/18/2018   HDL 47 (L) 03/18/2018   LDLCALC 89 03/18/2018   TRIG 118 03/18/2018   CHOLHDL 3.3 03/18/2018    Mood : fine  Has not had problems  A little irritable coming of OC for 1-2 days- better now  Dysthymia in the past -now better   Patient Active Problem List   Diagnosis Date Noted  . Contraception management 03/24/2019  . Poor concentration 10/19/2017  . Hand tingling 06/07/2015  . Encounter for screening for HIV 02/25/2015  . Enlarged thyroid gland 02/19/2014  . Anal skin tag 01/09/2014  . Encounter for routine gynecological examination 11/16/2011  . Routine general medical examination at a health care facility 11/16/2011  . 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 ALLERGIC RHINITIS 11/10/2006   No past medical history on file. No past surgical history on file. Social History   Tobacco  Use  . Smoking status: Former Games developer  . Smokeless tobacco: Never Used  Substance Use Topics  . Alcohol use: Yes    Alcohol/week: 0.0 standard drinks    Comment: occassionally  . Drug use: No   Family History  Problem Relation Age of Onset  . Breast cancer Paternal Grandmother    No Known Allergies No current outpatient medications on file prior to visit.   No current facility-administered medications on file prior to visit.      Review of Systems  Constitutional: Negative for activity change, appetite change, fatigue, fever and unexpected weight change.  HENT: Negative for congestion, ear pain, rhinorrhea, sinus pressure and sore throat.   Eyes: Negative for pain, redness and visual disturbance.  Respiratory: Negative for cough, shortness of breath and wheezing.   Cardiovascular: Negative for chest pain and palpitations.  Gastrointestinal: Negative for abdominal pain, blood in stool, constipation and diarrhea.  Endocrine: Negative for polydipsia and polyuria.  Genitourinary: Positive for menstrual problem. Negative for dysuria, frequency and urgency.  Musculoskeletal: Negative for arthralgias, back pain and myalgias.  Skin: Negative for pallor and rash.  Allergic/Immunologic: Negative for environmental allergies.  Neurological: Negative for dizziness, syncope and headaches.  Hematological: Negative for adenopathy. Does  not bruise/bleed easily.  Psychiatric/Behavioral: Negative for decreased concentration and dysphoric mood. The patient is not nervous/anxious.        Objective:   Physical Exam Constitutional:      General: She is not in acute distress.    Appearance: Normal appearance. She is well-developed and normal weight. She is not ill-appearing or diaphoretic.  HENT:     Head: Normocephalic and atraumatic.     Right Ear: Tympanic membrane, ear canal and external ear normal.     Left Ear: Tympanic membrane, ear canal and external ear normal.     Nose: Nose normal. No  congestion.     Mouth/Throat:     Mouth: Mucous membranes are moist.     Pharynx: Oropharynx is clear. No posterior oropharyngeal erythema.  Eyes:     General: No scleral icterus.    Extraocular Movements: Extraocular movements intact.     Conjunctiva/sclera: Conjunctivae normal.     Pupils: Pupils are equal, round, and reactive to light.  Neck:     Musculoskeletal: Normal range of motion and neck supple. No neck rigidity or muscular tenderness.     Thyroid: No thyromegaly.     Vascular: No carotid bruit or JVD.  Cardiovascular:     Rate and Rhythm: Normal rate and regular rhythm.     Pulses: Normal pulses.     Heart sounds: Normal heart sounds. No gallop.   Pulmonary:     Effort: Pulmonary effort is normal. No respiratory distress.     Breath sounds: Normal breath sounds. No wheezing.     Comments: Good air exch Chest:     Chest wall: No tenderness.  Abdominal:     General: Bowel sounds are normal. There is no distension or abdominal bruit.     Palpations: Abdomen is soft. There is no mass.     Tenderness: There is no abdominal tenderness.     Hernia: No hernia is present.  Genitourinary:    Comments: Breast exam: No mass, nodules, thickening, tenderness, bulging, retraction, inflamation, nipple discharge or skin changes noted.  No axillary or clavicular LA.     Musculoskeletal: Normal range of motion.        General: No tenderness.     Right lower leg: No edema.     Left lower leg: No edema.  Lymphadenopathy:     Cervical: No cervical adenopathy.  Skin:    General: Skin is warm and dry.     Coloration: Skin is not pale.     Findings: No erythema or rash.     Comments: Solar lentigines diffusely   Neurological:     Mental Status: She is alert. Mental status is at baseline.     Cranial Nerves: No cranial nerve deficit.     Motor: No abnormal muscle tone.     Coordination: Coordination normal.     Gait: Gait normal.     Deep Tendon Reflexes: Reflexes are normal and  symmetric. Reflexes normal.  Psychiatric:        Mood and Affect: Mood normal.        Cognition and Memory: Cognition normal.     Comments: Pleasant            Assessment & Plan:   Problem List Items Addressed This Visit      Other   Routine general medical examination at a health care facility - Primary    Reviewed health habits including diet and exercise and skin cancer prevention Reviewed appropriate screening  tests for age  Also reviewed health mt list, fam hx and immunization status , as well as social and family history   See HPI Labs rev from last year  Flu shot given today  Ref to gyn to discuss contraception  Enc healthy diet and exercise       Relevant Orders   Ambulatory referral to Obstetrics / Gynecology   Contraception management    Pt is interested in non hormonal contraception Currently using condoms and adjusting to being off OC  Some spotting  Ref to gyn to discuss contraception ( interested in non hormonal iud)       Relevant Orders   Ambulatory referral to Obstetrics / Gynecology    Other Visit Diagnoses    Missed period       Relevant Orders   POCT urine pregnancy (Completed)   Need for influenza vaccination       Relevant Orders   Flu Vaccine QUAD 6+ mos PF IM (Fluarix Quad PF) (Completed)

## 2019-03-24 NOTE — Patient Instructions (Addendum)
Our office will call you about the gyn referral  Use condoms in the meantime   Flu shot today   Take care of yourself  Keep walking/exercising   Eat a healthy diet

## 2019-03-26 NOTE — Assessment & Plan Note (Signed)
Reviewed health habits including diet and exercise and skin cancer prevention Reviewed appropriate screening tests for age  Also reviewed health mt list, fam hx and immunization status , as well as social and family history   See HPI Labs rev from last year  Flu shot given today  Ref to gyn to discuss contraception  Enc healthy diet and exercise

## 2019-03-26 NOTE — Assessment & Plan Note (Signed)
Pt is interested in non hormonal contraception Currently using condoms and adjusting to being off OC  Some spotting  Ref to gyn to discuss contraception ( interested in non hormonal iud)

## 2019-05-05 ENCOUNTER — Other Ambulatory Visit: Payer: Self-pay

## 2019-05-05 ENCOUNTER — Ambulatory Visit (INDEPENDENT_AMBULATORY_CARE_PROVIDER_SITE_OTHER): Payer: BC Managed Care – PPO | Admitting: Obstetrics and Gynecology

## 2019-05-05 ENCOUNTER — Encounter: Payer: Self-pay | Admitting: Obstetrics and Gynecology

## 2019-05-05 VITALS — BP 110/70 | Ht 66.0 in | Wt 169.0 lb

## 2019-05-05 DIAGNOSIS — Z3009 Encounter for other general counseling and advice on contraception: Secondary | ICD-10-CM | POA: Diagnosis not present

## 2019-05-05 NOTE — Progress Notes (Signed)
Patient ID: Rita Salas, female   DOB: Dec 19, 1985, 33 y.o.   MRN: [id number]  Reason for Consult: Gynecologic Exam (no concerns) and Contraception (interested in IUD)   Referred by Judy Pimple, MD  Subjective:     HPI:  Rita Salas is a 33 y.o. female. She is here today to discuss the ParaGard IUD. She is interested in the as a form of contraception and would like to discuss it and possible side effects or complications. She is currently using OCPs for birth control and would like to transition to a non-hormonal method. She generally has 1-2 days of bleeding currently with her OCP. Her pap smear in 2018 was NIL.    Past Medical History:  Diagnosis Date  . No pertinent past medical history    Family History  Problem Relation Age of Onset  . Breast cancer Paternal Grandmother 20   Past Surgical History:  Procedure Laterality Date  . PILONIDAL CYST EXCISION      Short Social History:  Social History   Tobacco Use  . Smoking status: Former Games developer  . Smokeless tobacco: Never Used  Substance Use Topics  . Alcohol use: Yes    Alcohol/week: 0.0 standard drinks    Comment: occassionally    No Known Allergies  No current outpatient medications on file.   No current facility-administered medications for this visit.    Review of Systems  Constitutional: Negative for chills, fatigue, fever and unexpected weight change.  HENT: Negative for trouble swallowing.  Eyes: Negative for loss of vision.  Respiratory: Negative for cough, shortness of breath and wheezing.  Cardiovascular: Negative for chest pain, leg swelling, palpitations and syncope.  GI: Negative for abdominal pain, blood in stool, diarrhea, nausea and vomiting.  GU: Negative for difficulty urinating, dysuria, frequency and hematuria.  Musculoskeletal: Negative for back pain, leg pain and joint pain.  Skin: Negative for rash.  Neurological: Negative for dizziness, headaches, light-headedness, numbness  and seizures.  Psychiatric: Negative for behavioral problem, confusion, depressed mood and sleep disturbance.        Objective:  Objective   Vitals:   05/05/19 0923  BP: 110/70  Weight: 169 lb (76.7 kg)  Height: 5\' 6"  (1.676 m)   Body mass index is 27.28 kg/m.  Physical Exam Vitals and nursing note reviewed.  Constitutional:      Appearance: She is well-developed.  HENT:     Head: Normocephalic and atraumatic.  Eyes:     Pupils: Pupils are equal, round, and reactive to light.  Cardiovascular:     Rate and Rhythm: Normal rate and regular rhythm.  Pulmonary:     Effort: Pulmonary effort is normal. No respiratory distress.  Skin:    General: Skin is warm and dry.  Neurological:     Mental Status: She is alert and oriented to person, place, and time.  Psychiatric:        Behavior: Behavior normal.        Thought Content: Thought content normal.        Judgment: Judgment normal.        Assessment/Plan:     33 yo [id number]  Present today for a birth control consultation Discussed option for various LARCs and the Paragard IUD. Provided patient with brochures and discussed IUD insertion process and scheduling. She will call the office on the first day of her next menstrual period if she desires IUD insertion. Encouraged patient to review [email or web address] for further information on contraceptive options.  More than 20 minutes were spent face to face with the patient in the room with more than 50% of the time spent providing counseling and discussing the plan of management.   Adrian Prows MD [facility] 05/05/2019 9:57 AM

## 2019-05-05 NOTE — Patient Instructions (Signed)

## 2019-05-30 DIAGNOSIS — U071 COVID-19: Secondary | ICD-10-CM | POA: Diagnosis not present

## 2019-05-30 DIAGNOSIS — Z20828 Contact with and (suspected) exposure to other viral communicable diseases: Secondary | ICD-10-CM | POA: Diagnosis not present

## 2020-02-02 ENCOUNTER — Encounter: Payer: Self-pay | Admitting: Family Medicine

## 2020-07-02 ENCOUNTER — Other Ambulatory Visit: Payer: Self-pay

## 2020-07-02 ENCOUNTER — Ambulatory Visit
Admission: EM | Admit: 2020-07-02 | Discharge: 2020-07-02 | Disposition: A | Discharge status: Home / Self Care | Payer: 59 | Attending: Family Medicine | Admitting: Family Medicine

## 2020-07-02 ENCOUNTER — Telehealth: Payer: Self-pay

## 2020-07-02 DIAGNOSIS — R0789 Other chest pain: Secondary | ICD-10-CM | POA: Diagnosis not present

## 2020-07-02 MED ORDER — PANTOPRAZOLE SODIUM 40 MG PO TBEC: 40.0000 mg | DELAYED_RELEASE_TABLET | Freq: Every day | ORAL | 0 refills | Status: DC

## 2020-07-02 NOTE — Discharge Instructions (Signed)
Medication as prescribed.  Tylenol as needed.  Keep an eye on BP (check once daily).  Follow up with PCP.  Take care   Dr. Adriana Simas

## 2020-07-02 NOTE — Telephone Encounter (Signed)
I spoke with pt; pt said for couple of weeks on and off has had chest soreness with heaviness and pressure feeling in chest. This morning at home around 7AM BP was 140/100. Pt is presently at work in [employer] and checked BP now; BP 154/99 P 109. Pt said has not been upset or anxious (feeling anxious now due to BP being up). Pt is presently having chest soreness and heavy pressure feeling in chest. No H/A, dizziness or SOB. Pt is not on any meds. Pt said she would not call the chest soreness "pain" but she is aware it is there. pts husband will take pt to [facility] in [place] for eval and any needed testing now. Sending note to DR [employer] as Lorain Childes.

## 2020-07-02 NOTE — ED Triage Notes (Addendum)
Pt states past two weeks of intermittent chest discomfort. More on the left side. Checked b/p the past 2 days and it has been elevated without prior diagnosis. Not really "pain" but more of a sensation in her chest. No radiation. No dizziness, no SOB

## 2020-07-02 NOTE — Telephone Encounter (Signed)
Aware, will watch for correspondence  

## 2020-07-02 NOTE — Telephone Encounter (Signed)
[facility] Night - Client Nonclinical Telephone Record AccessNurse Client [facility] Night - Client Client Site [facility] - Night Physician Roxy Manns - MD Contact Type Call Who Is Calling Patient / Member / Family / Caregiver Caller Name Rita Salas Caller Phone Number [phone] Patient Name Rita Salas Patient DOB 1986-04-25 Call Type Message Only Information Provided Reason for Call Request to Schedule Office Appointment Initial Comment Caller states that she needs to schedule an appointment. Her blood pressure has been 140/100 this morning. Additional Comment Caller declined triage and just asked that the office gives her a call back to schedule an appointment regarding her blood pressure. Disp. Time Disposition Final User 07/02/2020 7:59:26 AM General Information Provided Yes Cherylynn Ridges Call Closed By: Cherylynn Ridges Transaction Date/Time: 07/02/2020 7:57:10 AM (ET)

## 2020-07-02 NOTE — ED Provider Notes (Signed)
[facility]    CSN: [id number] Arrival date & time: 07/02/20  1009      History   Chief Complaint Chief Complaint  Patient presents with  . Chest Pain   HPI  35 year old female presents with the above complaint.  2-week history of intermittent left-sided chest discomfort.  Has checked her blood pressure recently and it has been elevated.  Discomfort is 1/10 in severity.  It is intermittent.  No shortness of breath.  No dizziness.  She does note that she has had some more heartburn recently.  She is unsure if this is contributing.  She states that there is some stress at work.  She denies any other potential contributing factors.  No fever.  No other associated symptoms.  No other complaints.  Patient Active Problem List   Diagnosis Date Noted  . Contraception management 03/24/2019  . Poor concentration 10/19/2017  . Hand tingling 06/07/2015  . Encounter for screening for HIV 02/25/2015  . Enlarged thyroid gland 02/19/2014  . Anal skin tag 01/09/2014  . Encounter for routine gynecological examination 11/16/2011  . Routine general medical examination at a health care facility 11/16/2011  . 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 ALLERGIC RHINITIS 11/10/2006    Past Surgical History:  Procedure Laterality Date  . PILONIDAL CYST EXCISION      OB History    Gravida  0   Para  0   Term  0   Preterm  0   AB  0   Living  0     SAB  0   IAB  0   Ectopic  0   Multiple  0   Live Births  0            Home Medications    Prior to Admission medications   Medication Sig Start Date End Date Taking? Authorizing Provider  pantoprazole (PROTONIX) 40 MG tablet Take 1 tablet (40 mg total) by mouth daily. 07/02/20  Yes Tommie Sams, DO    Family History Family History  Problem Relation Age of Onset  . Breast cancer Paternal Grandmother 7    Social History Social History   Tobacco Use  . Smoking status: Former Games developer  . Smokeless tobacco: Never Used  Vaping  Use  . Vaping Use: Never used  Substance Use Topics  . Alcohol use: Yes    Alcohol/week: 0.0 standard drinks    Comment: occassionally  . Drug use: No     Allergies   Patient has no known allergies.   Review of Systems Review of Systems  Constitutional: Negative.   Cardiovascular: Positive for chest pain.   Physical Exam Triage Vital Signs ED Triage Vitals  Enc Vitals Group     BP 07/02/20 1018 (!) 150/97     Pulse Rate 07/02/20 1018 89     Resp 07/02/20 1018 17     Temp 07/02/20 1018 98.3 F (36.8 C)     Temp Source 07/02/20 1018 Oral     SpO2 07/02/20 1018 100 %     Weight 07/02/20 1020 180 lb (81.6 kg)     Height 07/02/20 1020 5\' 6"  (1.676 m)     Head Circumference --      Peak Flow --      Pain Score 07/02/20 1018 1     Pain Loc --      Pain Edu? --      Excl. in GC? --    Updated Vital Signs BP (!) 150/97 (  BP Location: Left Arm)   Pulse 89   Temp 98.3 F (36.8 C) (Oral)   Resp 17   Ht 5\' 6"  (1.676 m)   Wt 81.6 kg   LMP 06/05/2020   SpO2 100%   BMI 29.05 kg/m   Visual Acuity Right Eye Distance:   Left Eye Distance:   Bilateral Distance:    Right Eye Near:   Left Eye Near:    Bilateral Near:     Physical Exam Constitutional:      General: She is not in acute distress.    Appearance: Normal appearance. She is well-developed. She is not ill-appearing.  HENT:     Head: Normocephalic and atraumatic.  Eyes:     General:        Right eye: No discharge.        Left eye: No discharge.     Conjunctiva/sclera: Conjunctivae normal.  Cardiovascular:     Rate and Rhythm: Normal rate and regular rhythm.     Heart sounds: No murmur heard.   Pulmonary:     Effort: Pulmonary effort is normal.     Breath sounds: Normal breath sounds. No wheezing, rhonchi or rales.  Abdominal:     General: There is no distension.     Palpations: Abdomen is soft.     Tenderness: There is no abdominal tenderness.  Neurological:     Mental Status: She is alert.   Psychiatric:        Mood and Affect: Mood normal.        Behavior: Behavior normal.    UC Treatments / Results  Labs (all labs ordered are listed, but only abnormal results are displayed) Labs Reviewed - No data to display  EKG Interpretation: Normal sinus rhythm with sinus arrhythmia.  Normal axis.  No ST or T wave changes.  Normal EKG.  Radiology No results found.  Procedures Procedures (including critical care time)  Medications Ordered in UC Medications - No data to display  Initial Impression / Assessment and Plan / UC Course  I have reviewed the triage vital signs and the nursing notes.  Pertinent labs & imaging results that were available during my care of the patient were reviewed by me and considered in my medical decision making (see chart for details).    35 year old female presents with atypical chest pain.  EKG normal.  Does not appear to be cardiac in nature.  Possible GERD component.  Placing on Protonix.  Advised Tylenol as needed.  Supportive care.  Final Clinical Impressions(s) / UC Diagnoses   Final diagnoses:  Atypical chest pain     Discharge Instructions     Medication as prescribed.  Tylenol as needed.  Keep an eye on BP (check once daily).  Follow up with PCP.  Take care   Dr. 31    ED Prescriptions    Medication Sig Dispense Auth. Provider   pantoprazole (PROTONIX) 40 MG tablet Take 1 tablet (40 mg total) by mouth daily. 30 tablet Adriana Simas, DO     PDMP not reviewed this encounter.   Tommie Sams, Tommie Sams 07/02/20 1352

## 2020-07-05 ENCOUNTER — Other Ambulatory Visit: Payer: Self-pay

## 2020-07-05 ENCOUNTER — Encounter: Payer: Self-pay | Admitting: Family Medicine

## 2020-07-05 ENCOUNTER — Ambulatory Visit (INDEPENDENT_AMBULATORY_CARE_PROVIDER_SITE_OTHER): Payer: 59 | Admitting: Family Medicine

## 2020-07-05 DIAGNOSIS — R0789 Other chest pain: Secondary | ICD-10-CM | POA: Insufficient documentation

## 2020-07-05 NOTE — Assessment & Plan Note (Signed)
Mid to left chest  Went to UC and notes/ekg reviewed (elevated bp suspect due to stress rxn) Reassuring  Denies new anxiety or stressors or new exercise Today- symptoms are better (no symptoms during visit)  Disc possible GERD symptoms  Will try tums if it returns, consider filling protonix that was given or pepcid otc  Recommend good self care  Watch for new symptoms and will update  If severe-go to ER

## 2020-07-05 NOTE — Progress Notes (Signed)
Subjective:    Patient ID: Rita Salas, female    DOB: 1985-07-06, 35 y.o.   MRN: [id number]  This visit occurred during the SARS-CoV-2 public health emergency.  Safety protocols were in place, including screening questions prior to the visit, additional usage of staff PPE, and extensive cleaning of exam room while observing appropriate contact time as indicated for disinfecting solutions.    HPI  Pt presents for f/u from urgent care visit   Wt Readings from Last 3 Encounters:  07/05/20 178 lb 4 oz (80.9 kg)  07/02/20 180 lb (81.6 kg)  05/05/19 169 lb (76.7 kg)  28.77 kg/m   She was seen on 2/15 for chest discomfort (mild/intermittent with inc bp)  EKG was normal  Did not appear cardiac in nature  ? GERD component, so was placed on protonix (which she did not start)    BP Readings from Last 3 Encounters:  07/05/20 122/68  07/02/20 (!) 150/97  05/05/19 110/70   Pulse Readings from Last 3 Encounters:  07/05/20 87  07/02/20 89  03/24/19 87   Today she feels fine  No chest discomfort since lunch yesterday (intermittent - no particular triggers)  No exertional or assoc with eating  More frequent heartburn in the past months - but that feels different   No nausea  Does not feel stressed (the usual at work)  Sitting more because she has been reading a lot  Not enough exercise   Both parents have HTN  No vascular disease  Mother has GERD  Patient Active Problem List   Diagnosis Date Noted  . Chest discomfort 07/05/2020  . Contraception management 03/24/2019  . Poor concentration 10/19/2017  . Hand tingling 06/07/2015  . Encounter for screening for HIV 02/25/2015  . Enlarged thyroid gland 02/19/2014  . Anal skin tag 01/09/2014  . Encounter for routine gynecological examination 11/16/2011  . Routine general medical examination at a health care facility 11/16/2011  . 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 ALLERGIC RHINITIS 11/10/2006   Past Medical History:  Diagnosis Date  .  No pertinent past medical history    Past Surgical History:  Procedure Laterality Date  . PILONIDAL CYST EXCISION     Social History   Tobacco Use  . Smoking status: Former Games developer  . Smokeless tobacco: Never Used  Vaping Use  . Vaping Use: Never used  Substance Use Topics  . Alcohol use: Yes    Alcohol/week: 0.0 standard drinks    Comment: occassionally  . Drug use: No   Family History  Problem Relation Age of Onset  . Breast cancer Paternal Grandmother 20  . Hypertension Mother   . Hypertension Father    No Known Allergies Current Outpatient Medications on File Prior to Visit  Medication Sig Dispense Refill  . pantoprazole (PROTONIX) 40 MG tablet Take 1 tablet (40 mg total) by mouth daily. (Patient not taking: Reported on 07/05/2020) 30 tablet 0   No current facility-administered medications on file prior to visit.    Review of Systems  Constitutional: Negative for activity change, appetite change, fatigue, fever and unexpected weight change.  HENT: Negative for congestion, ear pain, rhinorrhea, sinus pressure and sore throat.   Eyes: Negative for pain, redness and visual disturbance.  Respiratory: Negative for cough, shortness of breath and wheezing.   Cardiovascular: Positive for chest pain. Negative for palpitations and leg swelling.  Gastrointestinal: Negative for abdominal distention, abdominal pain, blood in stool, constipation, diarrhea, nausea, rectal pain and vomiting.  No heartburn currently but has had more lately  Not assoc with chest discomofort in timing  Endocrine: Negative for polydipsia and polyuria.  Genitourinary: Negative for dysuria, frequency and urgency.  Musculoskeletal: Negative for arthralgias, back pain and myalgias.  Skin: Negative for pallor and rash.  Allergic/Immunologic: Negative for environmental allergies.  Neurological: Negative for dizziness, syncope and headaches.  Hematological: Negative for adenopathy. Does not bruise/bleed  easily.  Psychiatric/Behavioral: Negative for decreased concentration and dysphoric mood. The patient is not nervous/anxious.        Objective:   Physical Exam Constitutional:      General: She is not in acute distress.    Appearance: Normal appearance. She is well-developed and well-nourished. She is not ill-appearing.  HENT:     Head: Normocephalic and atraumatic.     Mouth/Throat:     Mouth: Oropharynx is clear and moist.  Eyes:     General: No scleral icterus.    Extraocular Movements: EOM normal.     Conjunctiva/sclera: Conjunctivae normal.     Pupils: Pupils are equal, round, and reactive to light.  Neck:     Thyroid: No thyromegaly.     Vascular: No carotid bruit or JVD.  Cardiovascular:     Rate and Rhythm: Normal rate and regular rhythm.     Pulses: Intact distal pulses.     Heart sounds: Normal heart sounds. No gallop.   Pulmonary:     Effort: Pulmonary effort is normal. No respiratory distress.     Breath sounds: Normal breath sounds. No wheezing or rales.     Comments: No cw or sternal tenderness and No crackles Chest:     Chest wall: No tenderness.  Abdominal:     General: Bowel sounds are normal. There is no distension or abdominal bruit.     Palpations: Abdomen is soft. There is no mass.     Tenderness: There is no abdominal tenderness. There is no guarding or rebound.  Musculoskeletal:        General: No edema.     Cervical back: Normal range of motion and neck supple. No tenderness.     Right lower leg: No edema.     Left lower leg: No edema.     Comments: No leg tenderness   Lymphadenopathy:     Cervical: No cervical adenopathy.  Skin:    General: Skin is warm and dry.     Findings: No rash.  Neurological:     Mental Status: She is alert.     Coordination: Coordination normal.     Deep Tendon Reflexes: Reflexes are normal and symmetric. Reflexes normal.  Psychiatric:        Mood and Affect: Mood and affect and mood normal.            Assessment & Plan:   Problem List Items Addressed This Visit      Other   Chest discomfort    Mid to left chest  Went to UC and notes/ekg reviewed (elevated bp suspect due to stress rxn) Reassuring  Denies new anxiety or stressors or new exercise Today- symptoms are better (no symptoms during visit)  Disc possible GERD symptoms  Will try tums if it returns, consider filling protonix that was given or pepcid otc  Recommend good self care  Watch for new symptoms and will update  If severe-go to ER

## 2020-07-05 NOTE — Patient Instructions (Addendum)
If chest pain returns you can try tums to see if it helps quickly   You can also fill the protonix px or get generic pepcid over the counter or omeprazole (prilosec)   If things change or worsen let us know  Watch for shortness of breath or nausea or other symptoms

## 2020-07-25 ENCOUNTER — Telehealth: Payer: Self-pay | Admitting: Family Medicine

## 2020-07-25 DIAGNOSIS — E049 Nontoxic goiter, unspecified: Secondary | ICD-10-CM

## 2020-07-25 DIAGNOSIS — Z0000 Encounter for general adult medical examination without abnormal findings: Secondary | ICD-10-CM

## 2020-07-25 NOTE — Telephone Encounter (Signed)
-----   Message from Terri J Walsh sent at 07/08/2020 11:04 AM EST ----- Regarding: Lab orders for Friday, 3.11.22 Patient is scheduled for CPX labs, please order future labs, Thanks , Terri   

## 2020-07-26 ENCOUNTER — Other Ambulatory Visit (INDEPENDENT_AMBULATORY_CARE_PROVIDER_SITE_OTHER): Payer: 59

## 2020-07-26 ENCOUNTER — Other Ambulatory Visit: Payer: Self-pay

## 2020-07-26 DIAGNOSIS — E049 Nontoxic goiter, unspecified: Secondary | ICD-10-CM | POA: Diagnosis not present

## 2020-07-26 DIAGNOSIS — Z0000 Encounter for general adult medical examination without abnormal findings: Secondary | ICD-10-CM | POA: Diagnosis not present

## 2020-07-26 LAB — LIPID PANEL
Cholesterol: 148 mg/dL (ref 0–200)
HDL: 47.5 mg/dL (ref >39.00)
LDL Cholesterol: 87 mg/dL (ref 0–99)
NonHDL: 100.81
Total CHOL/HDL Ratio: 3
Triglycerides: 69 mg/dL (ref 0.0–149.0)
VLDL: 13.8 mg/dL (ref 0.0–40.0)

## 2020-07-26 LAB — COMPREHENSIVE METABOLIC PANEL
ALT: 11 U/L (ref 0–35)
AST: 12 U/L (ref 0–37)
Albumin: 4.2 g/dL (ref 3.5–5.2)
Alkaline Phosphatase: 92 U/L (ref 39–117)
BUN: 12 mg/dL (ref 6–23)
CO2: 26 mEq/L (ref 19–32)
Calcium: 9.9 mg/dL (ref 8.4–10.5)
Chloride: 106 mEq/L (ref 96–112)
Creatinine, Ser: 0.77 mg/dL (ref 0.40–1.20)
GFR: 99.51 mL/min (ref >60.00)
Glucose, Bld: 89 mg/dL (ref 70–99)
Potassium: 4 mEq/L (ref 3.5–5.1)
Sodium: 138 mEq/L (ref 135–145)
Total Bilirubin: 0.6 mg/dL (ref 0.2–1.2)
Total Protein: 7.4 g/dL (ref 6.0–8.3)

## 2020-07-26 LAB — CBC WITH DIFFERENTIAL/PLATELET
Basophils Absolute: 0 10*3/uL (ref 0.0–0.1)
Basophils Relative: 0.2 % (ref 0.0–3.0)
Eosinophils Absolute: 0.1 10*3/uL (ref 0.0–0.7)
Eosinophils Relative: 0.8 % (ref 0.0–5.0)
HCT: 38 % (ref 36.0–46.0)
Hemoglobin: 12.9 g/dL (ref 12.0–15.0)
Lymphocytes Relative: 22.8 % (ref 12.0–46.0)
Lymphs Abs: 1.6 10*3/uL (ref 0.7–4.0)
MCHC: 33.9 g/dL (ref 30.0–36.0)
MCV: 88.5 fl (ref 78.0–100.0)
Monocytes Absolute: 0.6 10*3/uL (ref 0.1–1.0)
Monocytes Relative: 9.1 % (ref 3.0–12.0)
Neutro Abs: 4.8 10*3/uL (ref 1.4–7.7)
Neutrophils Relative %: 67.1 % (ref 43.0–77.0)
Platelets: 248 10*3/uL (ref 150.0–400.0)
RBC: 4.3 Mil/uL (ref 3.87–5.11)
RDW: 13 % (ref 11.5–15.5)
WBC: 7.1 10*3/uL (ref 4.0–10.5)

## 2020-07-26 LAB — TSH: TSH: 1.67 u[IU]/mL (ref 0.35–4.50)

## 2020-08-02 ENCOUNTER — Encounter: Payer: 59 | Admitting: Family Medicine

## 2020-09-13 ENCOUNTER — Encounter: Payer: 59 | Admitting: Family Medicine

## 2020-09-27 ENCOUNTER — Other Ambulatory Visit: Payer: Self-pay

## 2020-09-27 ENCOUNTER — Ambulatory Visit (INDEPENDENT_AMBULATORY_CARE_PROVIDER_SITE_OTHER): Payer: 59 | Admitting: Family Medicine

## 2020-09-27 ENCOUNTER — Encounter: Payer: Self-pay | Admitting: Family Medicine

## 2020-09-27 VITALS — BP 122/68 | HR 88 | Temp 96.9°F | Ht 66.25 in | Wt 178.1 lb

## 2020-09-27 DIAGNOSIS — Z0000 Encounter for general adult medical examination without abnormal findings: Secondary | ICD-10-CM | POA: Diagnosis not present

## 2020-09-27 DIAGNOSIS — Z3009 Encounter for other general counseling and advice on contraception: Secondary | ICD-10-CM | POA: Diagnosis not present

## 2020-09-27 DIAGNOSIS — E049 Nontoxic goiter, unspecified: Secondary | ICD-10-CM

## 2020-09-27 NOTE — Patient Instructions (Addendum)
Keep up the good work taking care of yourself   Drink lots of water  Exercise regularly   Follow up with gyn as needed

## 2020-09-27 NOTE — Progress Notes (Signed)
Subjective:    Patient ID: Rita Salas, female    DOB: June 20, 1985, 35 y.o.   MRN: [id number]  This visit occurred during the SARS-CoV-2 public health emergency.  Safety protocols were in place, including screening questions prior to the visit, additional usage of staff PPE, and extensive cleaning of exam room while observing appropriate contact time as indicated for disinfecting solutions.    HPI Here for health maintenance exam and to review chronic medical problems    Wt Readings from Last 3 Encounters:  09/27/20 178 lb 2 oz (80.8 kg)  07/05/20 178 lb 4 oz (80.9 kg)  07/02/20 180 lb (81.6 kg)   28.53 kg/m  Has been working  Taking care of herself   Walking and stationary bike  A mole under L arm- raised   covid status; had covid jan 2021  Not immunized    Pap 10/18 -negative  No h/o abn pap  Does not think she needs STD testing  irreg menses- and some cramping (since being off OC) Is tolerable   Not interested in sterilization yet   LMP 09/17/20  Contraception  Discussed IUD but did not do it  Dr Jerene Pitch  Has not had a pap  Right now sticking with condoms    Self breast exam -no lumps   Flu shot - dd not get this season  Tdap 11/19  Labs Cholesterol Lab Results  Component Value Date   CHOL 148 07/26/2020   CHOL 157 03/18/2018   CHOL 155 03/12/2017   Lab Results  Component Value Date   HDL 47.50 07/26/2020   HDL 47 (L) 03/18/2018   HDL 45 (L) 03/12/2017   Lab Results  Component Value Date   LDLCALC 87 07/26/2020   LDLCALC 89 03/18/2018   LDLCALC 93 03/12/2017   Lab Results  Component Value Date   TRIG 69.0 07/26/2020   TRIG 118 03/18/2018   TRIG 79 03/12/2017   Lab Results  Component Value Date   CHOLHDL 3 07/26/2020   CHOLHDL 3.3 03/18/2018   CHOLHDL 3.4 03/12/2017   No results found for: LDLDIRECT  Is eating pretty healthy -better lately about taking a lunch, etc   Other labs  Results for orders placed or performed in  visit on 07/26/20  TSH  Result Value Ref Range   TSH 1.67 0.35 - 4.50 uIU/mL  Lipid panel  Result Value Ref Range   Cholesterol 148 0 - 200 mg/dL   Triglycerides 37.6 0.0 - 149.0 mg/dL   HDL 28.31 >51.76 mg/dL   VLDL 16.0 0.0 - 73.7 mg/dL   LDL Cholesterol 87 0 - 99 mg/dL   Total CHOL/HDL Ratio 3    NonHDL 100.81   Comprehensive metabolic panel  Result Value Ref Range   Sodium 138 135 - 145 mEq/L   Potassium 4.0 3.5 - 5.1 mEq/L   Chloride 106 96 - 112 mEq/L   CO2 26 19 - 32 mEq/L   Glucose, Bld 89 70 - 99 mg/dL   BUN 12 6 - 23 mg/dL   Creatinine, Ser 1.06 0.40 - 1.20 mg/dL   Total Bilirubin 0.6 0.2 - 1.2 mg/dL   Alkaline Phosphatase 92 39 - 117 U/L   AST 12 0 - 37 U/L   ALT 11 0 - 35 U/L   Total Protein 7.4 6.0 - 8.3 g/dL   Albumin 4.2 3.5 - 5.2 g/dL   GFR 26.94 >85.46 mL/min   Calcium 9.9 8.4 - 10.5 mg/dL  CBC with Differential/Platelet  Result Value Ref Range   WBC 7.1 4.0 - 10.5 K/uL   RBC 4.30 3.87 - 5.11 Mil/uL   Hemoglobin 12.9 12.0 - 15.0 g/dL   HCT 06.2 69.4 - 85.4 %   MCV 88.5 78.0 - 100.0 fl   MCHC 33.9 30.0 - 36.0 g/dL   RDW 62.7 03.5 - 00.9 %   Platelets 248.0 150.0 - 400.0 K/uL   Neutrophils Relative % 67.1 43.0 - 77.0 %   Lymphocytes Relative 22.8 12.0 - 46.0 %   Monocytes Relative 9.1 3.0 - 12.0 %   Eosinophils Relative 0.8 0.0 - 5.0 %   Basophils Relative 0.2 0.0 - 3.0 %   Neutro Abs 4.8 1.4 - 7.7 K/uL   Lymphs Abs 1.6 0.7 - 4.0 K/uL   Monocytes Absolute 0.6 0.1 - 1.0 K/uL   Eosinophils Absolute 0.1 0.0 - 0.7 K/uL   Basophils Absolute 0.0 0.0 - 0.1 K/uL    Patient Active Problem List   Diagnosis Date Noted  . Contraception management 03/24/2019  . Poor concentration 10/19/2017  . Hand tingling 06/07/2015  . Encounter for screening for HIV 02/25/2015  . Enlarged thyroid gland 02/19/2014  . Anal skin tag 01/09/2014  . Encounter for routine gynecological examination 11/16/2011  . Routine general medical examination at a health care facility  11/16/2011  . 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 ALLERGIC RHINITIS 11/10/2006   Past Medical History:  Diagnosis Date  . No pertinent past medical history    Past Surgical History:  Procedure Laterality Date  . PILONIDAL CYST EXCISION     Social History   Tobacco Use  . Smoking status: Former Games developer  . Smokeless tobacco: Never Used  Vaping Use  . Vaping Use: Never used  Substance Use Topics  . Alcohol use: Yes    Alcohol/week: 0.0 standard drinks    Comment: occassionally  . Drug use: No   Family History  Problem Relation Age of Onset  . Breast cancer Paternal Grandmother 53  . Hypertension Mother   . Hypertension Father    No Known Allergies No current outpatient medications on file prior to visit.   No current facility-administered medications on file prior to visit.     Review of Systems  Constitutional: Negative for activity change, appetite change, fatigue, fever and unexpected weight change.  HENT: Negative for congestion, ear pain, rhinorrhea, sinus pressure and sore throat.   Eyes: Negative for pain, redness and visual disturbance.  Respiratory: Negative for cough, shortness of breath and wheezing.   Cardiovascular: Negative for chest pain and palpitations.  Gastrointestinal: Negative for abdominal pain, blood in stool, constipation and diarrhea.  Endocrine: Negative for polydipsia and polyuria.  Genitourinary: Negative for dysuria, frequency and urgency.  Musculoskeletal: Negative for arthralgias, back pain and myalgias.  Skin: Negative for pallor and rash.  Allergic/Immunologic: Negative for environmental allergies.  Neurological: Negative for dizziness, syncope and headaches.  Hematological: Negative for adenopathy. Does not bruise/bleed easily.  Psychiatric/Behavioral: Negative for decreased concentration and dysphoric mood. The patient is not nervous/anxious.        Objective:   Physical Exam Constitutional:      General: She is not in acute distress.     Appearance: Normal appearance. She is well-developed and normal weight. She is not ill-appearing or diaphoretic.  HENT:     Head: Normocephalic and atraumatic.     Right Ear: Tympanic membrane, ear canal and external ear normal.     Left Ear: Tympanic membrane, ear canal and external ear normal.  Nose: Nose normal. No congestion.     Mouth/Throat:     Mouth: Mucous membranes are moist.     Pharynx: Oropharynx is clear. No posterior oropharyngeal erythema.  Eyes:     General: No scleral icterus.    Extraocular Movements: Extraocular movements intact.     Conjunctiva/sclera: Conjunctivae normal.     Pupils: Pupils are equal, round, and reactive to light.  Neck:     Thyroid: No thyromegaly.     Vascular: No carotid bruit or JVD.  Cardiovascular:     Rate and Rhythm: Normal rate and regular rhythm.     Pulses: Normal pulses.     Heart sounds: Normal heart sounds. No gallop.   Pulmonary:     Effort: Pulmonary effort is normal. No respiratory distress.     Breath sounds: Normal breath sounds. No wheezing.     Comments: Good air exch Chest:     Chest wall: No tenderness.  Abdominal:     General: Bowel sounds are normal. There is no distension or abdominal bruit.     Palpations: Abdomen is soft. There is no mass.     Tenderness: There is no abdominal tenderness.     Hernia: No hernia is present.  Genitourinary:    Comments: Breast exam: No mass, nodules, thickening, tenderness, bulging, retraction, inflamation, nipple discharge or skin changes noted.  No axillary or clavicular LA.     Musculoskeletal:        General: No tenderness. Normal range of motion.     Cervical back: Normal range of motion and neck supple. No rigidity. No muscular tenderness.     Right lower leg: No edema.     Left lower leg: No edema.  Lymphadenopathy:     Cervical: No cervical adenopathy.  Skin:    General: Skin is warm and dry.     Coloration: Skin is not pale.     Findings: No erythema or rash.      Comments: Some lentigines  SK in L axilla   Neurological:     Mental Status: She is alert. Mental status is at baseline.     Cranial Nerves: No cranial nerve deficit.     Motor: No abnormal muscle tone.     Coordination: Coordination normal.     Gait: Gait normal.     Deep Tendon Reflexes: Reflexes are normal and symmetric. Reflexes normal.  Psychiatric:        Mood and Affect: Mood normal.        Cognition and Memory: Cognition and memory normal.           Assessment & Plan:   Problem List Items Addressed This Visit      Endocrine   Enlarged thyroid gland    No change         Other   Routine general medical examination at a health care facility - Primary    Reviewed health habits including diet and exercise and skin cancer prevention Reviewed appropriate screening tests for age  Also reviewed health mt list, fam hx and immunization status , as well as social and family history   See HPI Labs reviewed  utd pap  Declines flu and covid vaccines Declines need for std screen Tdap utd Enc good diet and exercise       Contraception management    Pt continues to use condoms Discussed non hormonal IUD with gyn and decided against it for now

## 2020-09-29 NOTE — Assessment & Plan Note (Signed)
Reviewed health habits including diet and exercise and skin cancer prevention Reviewed appropriate screening tests for age  Also reviewed health mt list, fam hx and immunization status , as well as social and family history   See HPI Labs reviewed  utd pap  Declines flu and covid vaccines Declines need for std screen Tdap utd Enc good diet and exercise

## 2020-09-29 NOTE — Assessment & Plan Note (Signed)
No change 

## 2020-09-29 NOTE — Assessment & Plan Note (Signed)
Pt continues to use condoms Discussed non hormonal IUD with gyn and decided against it for now

## 2021-11-07 ENCOUNTER — Ambulatory Visit
Admission: EM | Admit: 2021-11-07 | Discharge: 2021-11-07 | Disposition: A | Discharge status: Home / Self Care | Payer: 59 | Attending: Emergency Medicine | Admitting: Emergency Medicine

## 2021-11-07 ENCOUNTER — Encounter: Payer: Self-pay | Admitting: Family Medicine

## 2021-11-07 ENCOUNTER — Encounter: Payer: 59 | Admitting: Family Medicine

## 2021-11-07 ENCOUNTER — Encounter: Payer: Self-pay | Admitting: Emergency Medicine

## 2021-11-07 DIAGNOSIS — J029 Acute pharyngitis, unspecified: Secondary | ICD-10-CM | POA: Diagnosis present

## 2021-11-07 MED ORDER — LIDOCAINE VISCOUS HCL 2 % MT SOLN: 15.0000 mL | OROMUCOSAL | 0 refills | Status: DC | PRN

## 2021-11-07 MED ORDER — CEFDINIR 300 MG PO CAPS: 300.0000 mg | ORAL_CAPSULE | Freq: Two times a day (BID) | ORAL | 0 refills | Status: AC

## 2021-11-07 NOTE — Progress Notes (Signed)
No Show

## 2021-11-07 NOTE — ED Provider Notes (Signed)
Rita Salas    CSN: [id number] Arrival date & time: 11/07/21  1628      History   Chief Complaint Chief Complaint  Patient presents with   Sore Throat    Fever. Swollen tonsils and lymph nodes. Ear pain. Have been treated for these same symptoms twice since May 26 - Entered by patient   Fever   Otalgia    HPI Rita Salas is a 36 y.o. female.   Patient presents with fever, bilateral ear pain, a mild nonproductive cough, sore throats and swollen tonsils beginning 1 day ago.  Endorses that she initially had similar symptoms on May 26 and was seen by an urgent care twice, rapid strep's have been negative and one positive culture, has been treated with prednisone and azithromycin with symptoms resolving but reoccurring within 2 to 3 days.  No known sick contacts.  Denies headaches, abdominal pain, nausea, vomiting or diarrhea, shortness of breath or wheezing.  Past Medical History:  Diagnosis Date   No pertinent past medical history     Patient Active Problem List   Diagnosis Date Noted   Contraception management 03/24/2019   Poor concentration 10/19/2017   Hand tingling 06/07/2015   Encounter for screening for HIV 02/25/2015   Enlarged thyroid gland 02/19/2014   Anal skin tag 01/09/2014   Encounter for routine gynecological examination 11/16/2011   Routine general medical examination at a health care facility 11/16/2011   HSV 11/10/2006   ALLERGIC RHINITIS 11/10/2006    Past Surgical History:  Procedure Laterality Date   PILONIDAL CYST EXCISION      OB History     Gravida  0   Para  0   Term  0   Preterm  0   AB  0   Living  0      SAB  0   IAB  0   Ectopic  0   Multiple  0   Live Births  0            Home Medications    Prior to Admission medications   Medication Sig Start Date End Date Taking? Authorizing Provider  cefdinir (OMNICEF) 300 MG capsule Take 1 capsule (300 mg total) by mouth 2 (two) times daily for 10 days.  11/07/21 11/17/21 Yes Nickolaus Bordelon R, NP  lidocaine (XYLOCAINE) 2 % solution Use as directed 15 mLs in the mouth or throat every 4 (four) hours as needed for mouth pain. 11/07/21  Yes Annabella Elford, Elita Boone, NP    Family History Family History  Problem Relation Age of Onset   Breast cancer Paternal Grandmother 2   Hypertension Mother    Hypertension Father     Social History Social History   Tobacco Use   Smoking status: Former   Smokeless tobacco: Never  Building services engineer Use: Never used  Substance Use Topics   Alcohol use: Yes    Alcohol/week: 0.0 standard drinks of alcohol    Comment: occassionally   Drug use: No     Allergies   Amoxicillin   Review of Systems Review of Systems Defer to HPI    Physical Exam Triage Vital Signs ED Triage Vitals  Enc Vitals Group     BP 11/07/21 1704 (!) 143/90     Pulse Rate 11/07/21 1704 (!) 138     Resp 11/07/21 1704 18     Temp 11/07/21 1704 (!) 101.7 F (38.7 C)     Temp Source 11/07/21 1704  Oral     SpO2 11/07/21 1704 97 %     Weight --      Height --      Head Circumference --      Peak Flow --      Pain Score 11/07/21 1701 0     Pain Loc --      Pain Edu? --      Excl. in GC? --    No data found.  Updated Vital Signs BP (!) 143/90 (BP Location: Right Arm)   Pulse (!) 138   Temp (!) 101.7 F (38.7 C) (Oral)   Resp 18   LMP 11/01/2021   SpO2 97%   Visual Acuity Right Eye Distance:   Left Eye Distance:   Bilateral Distance:    Right Eye Near:   Left Eye Near:    Bilateral Near:     Physical Exam Constitutional:      Appearance: She is well-developed.  HENT:     Head: Normocephalic.     Right Ear: Tympanic membrane and ear canal normal.     Left Ear: Tympanic membrane and ear canal normal.     Mouth/Throat:     Mouth: Mucous membranes are moist.     Pharynx: Posterior oropharyngeal erythema present.     Tonsils: Tonsillar exudate present. 2+ on the right. 2+ on the left.  Cardiovascular:      Rate and Rhythm: Normal rate and regular rhythm.     Heart sounds: Normal heart sounds.  Pulmonary:     Effort: Pulmonary effort is normal.     Breath sounds: Normal breath sounds.  Musculoskeletal:     Cervical back: Normal range of motion and neck supple.  Skin:    General: Skin is warm and dry.  Neurological:     General: No focal deficit present.     Mental Status: She is alert and oriented to person, place, and time.  Psychiatric:        Mood and Affect: Mood normal.        Behavior: Behavior normal.      UC Treatments / Results  Labs (all labs ordered are listed, but only abnormal results are displayed) Labs Reviewed  CULTURE, GROUP A STREP [facility])  POCT RAPID STREP A (OFFICE)    EKG   Radiology No results found.  Procedures Procedures (including critical care time)  Medications Ordered in UC Medications - No data to display  Initial Impression / Assessment and Plan / UC Course  I have reviewed the triage vital signs and the nursing notes.  Pertinent labs & imaging results that were available during my care of the patient were reviewed by me and considered in my medical decision making (see chart for details).  Sore throat Fever  of 101.7 with associated tachycardia noted in triage, patient is in no signs of distress, rapid strep test negative, sent to culture, erythema, tonsillar adenopathy and exudate to the right tonsil is noted on examination, patient endorses her baseline is tonsillar adenopathy but she feels that even today tonsils are swollen, will begin bacterial coverage, cefdinir 10-day course prescribed as patient confirms allergy to amoxicillin, given walking referral to ear nose and throat via MyChart, may follow-up with his urgent care as needed Final Clinical Impressions(s) / UC Diagnoses   Final diagnoses:  Sore throat     Discharge Instructions      On exam your tonsils are very large, slightly red and there is a Jaleil Renwick patch on  the right  side  Your rapid strep test is negative it has been sent over for culture  Based on your examination we will begin antibiotic coverage  Take cefdinir twice daily for the next 10 days  You may continue use of Tylenol and ibuprofen for fever management and for general comfort  May attempt use of salt water gargles, throat lozenges, warm liquids, soft foods, teaspoons of honey for additional support  May gargle and spit lidocaine solution every 4 hours as needed for temporary numbing effect to the throat  You been given information to the ear nose and throat specialist, if your symptoms recur after use of all medication you may schedule an appointment for further evaluation by the specialist, if unable to be seen you may follow-up with urgent care as neededcedfenir    ED Prescriptions     Medication Sig Dispense Auth. Provider   cefdinir (OMNICEF) 300 MG capsule Take 1 capsule (300 mg total) by mouth 2 (two) times daily for 10 days. 20 capsule Beya Tipps R, NP   lidocaine (XYLOCAINE) 2 % solution Use as directed 15 mLs in the mouth or throat every 4 (four) hours as needed for mouth pain. 100 mL Valinda Hoar, NP      PDMP not reviewed this encounter.   Valinda Hoar, NP 11/07/21 1754

## 2021-11-08 LAB — CULTURE, GROUP A STREP (THRC)

## 2021-11-10 LAB — CULTURE, GROUP A STREP (THRC)

## 2022-03-31 ENCOUNTER — Encounter: Payer: Self-pay | Admitting: Family Medicine

## 2022-03-31 ENCOUNTER — Ambulatory Visit: Payer: 59 | Admitting: Family Medicine

## 2022-03-31 VITALS — BP 118/80 | HR 90 | Temp 98.6°F | Ht 66.0 in | Wt 183.2 lb

## 2022-03-31 DIAGNOSIS — R220 Localized swelling, mass and lump, head: Secondary | ICD-10-CM | POA: Insufficient documentation

## 2022-03-31 MED ORDER — DOXYCYCLINE HYCLATE 100 MG PO TABS: 100.0000 mg | ORAL_TABLET | Freq: Two times a day (BID) | ORAL | 0 refills | Status: DC

## 2022-03-31 NOTE — Patient Instructions (Addendum)
Take doxycycline as directed for possible skin/nostril infection  Keep nose/face clean with soap and water  Watch for redness/pain or increased swelling or congestion  Keep [place] posted   A warm or cool compress may help   Update if not starting to improve in a week or if worsening

## 2022-03-31 NOTE — Progress Notes (Signed)
Subjective:    Patient ID: Rita Salas, female    DOB: 10/20/1985, 36 y.o.   MRN: [id number]  HPI Pt presents for swelling in nose   Wt Readings from Last 3 Encounters:  03/31/22 183 lb 3.2 oz (83.1 kg)  11/07/21 183 lb (83 kg)  09/27/20 178 lb 2 oz (80.8 kg)   29.57 kg/m   Started 2 weeks ago  Noticed tenderness of nose on the left side  Then noted some swelling  No obvious redness or warmth  Now it moved to the bridge of her nose  No trauma  Rarely wears her glasses   No fever  No sinus pain  No ST   No uri symptoms more than usual  Has a little congestion all year   Has piercing on L - for a year  This is lower than where the swelling is   Patient Active Problem List   Diagnosis Date Noted   Nasal swelling 03/31/2022   Contraception management 03/24/2019   Poor concentration 10/19/2017   Hand tingling 06/07/2015   Encounter for screening for HIV 02/25/2015   Enlarged thyroid gland 02/19/2014   Anal skin tag 01/09/2014   Encounter for routine gynecological examination 11/16/2011   Routine general medical examination at a health care facility 11/16/2011   HSV 11/10/2006   ALLERGIC RHINITIS 11/10/2006   Past Medical History:  Diagnosis Date   No pertinent past medical history    Past Surgical History:  Procedure Laterality Date   PILONIDAL CYST EXCISION     Social History   Tobacco Use   Smoking status: Former   Smokeless tobacco: Never  Vaping Use   Vaping Use: Never used  Substance Use Topics   Alcohol use: Yes    Alcohol/week: 0.0 standard drinks of alcohol    Comment: occassionally   Drug use: No   Family History  Problem Relation Age of Onset   Breast cancer Paternal Grandmother 40   Hypertension Mother    Hypertension Father    Allergies  Allergen Reactions   Amoxicillin Other (See Comments)    Other reaction(s): rash    No current outpatient medications on file prior to visit.   No current facility-administered  medications on file prior to visit.    Review of Systems  Constitutional:  Negative for activity change, appetite change, fatigue, fever and unexpected weight change.  HENT:  Negative for congestion, ear pain, rhinorrhea, sinus pressure and sore throat.        Swelling /discomfort over nasal bridge Is improved from yesterday   Eyes:  Negative for pain, redness and visual disturbance.  Respiratory:  Negative for cough, shortness of breath and wheezing.   Cardiovascular:  Negative for chest pain and palpitations.  Gastrointestinal:  Negative for abdominal pain, blood in stool, constipation and diarrhea.  Endocrine: Negative for polydipsia and polyuria.  Genitourinary:  Negative for dysuria, frequency and urgency.  Musculoskeletal:  Negative for arthralgias, back pain and myalgias.  Skin:  Negative for pallor and rash.  Allergic/Immunologic: Negative for environmental allergies.  Neurological:  Negative for dizziness, syncope and headaches.  Hematological:  Negative for adenopathy. Does not bruise/bleed easily.  Psychiatric/Behavioral:  Negative for decreased concentration and dysphoric mood. The patient is not nervous/anxious.        Objective:   Physical Exam Constitutional:      General: She is not in acute distress.    Appearance: Normal appearance. She is obese. She is not ill-appearing.  HENT:  Right Ear: Tympanic membrane, ear canal and external ear normal.     Left Ear: Tympanic membrane, ear canal and external ear normal.     Nose:     Comments: Very mild tenderness over bridge of nose  Mildly full appearing  No obvious erythema or warmth  No vesicles or rash   Piercing in L nostril is much lower and does not appear to be infected   Nares are mildly injected with boggy appearance       Mouth/Throat:     Mouth: Mucous membranes are moist.     Pharynx: Oropharynx is clear. No oropharyngeal exudate or posterior oropharyngeal erythema.  Eyes:     General:         Right eye: No discharge.        Left eye: No discharge.     Extraocular Movements: Extraocular movements intact.     Conjunctiva/sclera: Conjunctivae normal.     Pupils: Pupils are equal, round, and reactive to light.  Cardiovascular:     Rate and Rhythm: Normal rate.  Pulmonary:     Effort: Pulmonary effort is normal. No respiratory distress.     Breath sounds: Normal breath sounds.  Musculoskeletal:     Cervical back: Neck supple. No tenderness.  Lymphadenopathy:     Cervical: No cervical adenopathy.  Skin:    Coloration: Skin is not pale.     Findings: No bruising, lesion or rash.  Neurological:     Mental Status: She is alert.     Cranial Nerves: No cranial nerve deficit.  Psychiatric:        Mood and Affect: Mood normal.           Assessment & Plan:   Problem List Items Addressed This Visit       Other   Nasal swelling - Primary    L upper nose followed by proximal nasal bridge without trauma or other symptoms  Today very mild/mild tenderness  Has a nasal piercing that does not seem to be inflamed or infected  Disc poss of nostril infection or polyp   Px doxycycline to cover bacteria  Keep clean  Watch for congestion or worse swelling or discomfort  Update if not starting to improve in a week or if worsening  Will watch for any type of rash If not improving would consider ENT referral for more in depth eval

## 2022-03-31 NOTE — Assessment & Plan Note (Signed)
L upper nose followed by proximal nasal bridge without trauma or other symptoms  Today very mild/mild tenderness  Has a nasal piercing that does not seem to be inflamed or infected  Disc poss of nostril infection or polyp   Px doxycycline to cover bacteria  Keep clean  Watch for congestion or worse swelling or discomfort  Update if not starting to improve in a week or if worsening  Will watch for any type of rash If not improving would consider ENT referral for more in depth eval

## 2022-07-01 NOTE — Progress Notes (Signed)
This encounter was created in error - please disregard.

## 2022-10-07 ENCOUNTER — Ambulatory Visit: Payer: 59 | Admitting: Family Medicine

## 2023-10-27 ENCOUNTER — Encounter: Payer: Self-pay | Admitting: Family Medicine

## 2023-10-27 ENCOUNTER — Ambulatory Visit (INDEPENDENT_AMBULATORY_CARE_PROVIDER_SITE_OTHER): Admitting: Family Medicine

## 2023-10-27 ENCOUNTER — Other Ambulatory Visit (HOSPITAL_COMMUNITY)
Admission: RE | Admit: 2023-10-27 | Discharge: 2023-10-27 | Disposition: A | Discharge status: Home / Self Care | Source: Ambulatory Visit | Attending: Family Medicine | Admitting: Family Medicine

## 2023-10-27 VITALS — BP 126/68 | HR 81 | Temp 98.4°F | Ht 66.0 in | Wt 174.4 lb

## 2023-10-27 DIAGNOSIS — Z01419 Encounter for gynecological examination (general) (routine) without abnormal findings: Secondary | ICD-10-CM

## 2023-10-27 DIAGNOSIS — R5383 Other fatigue: Secondary | ICD-10-CM | POA: Insufficient documentation

## 2023-10-27 DIAGNOSIS — Z3009 Encounter for other general counseling and advice on contraception: Secondary | ICD-10-CM | POA: Diagnosis not present

## 2023-10-27 DIAGNOSIS — R5382 Chronic fatigue, unspecified: Secondary | ICD-10-CM

## 2023-10-27 DIAGNOSIS — Z0000 Encounter for general adult medical examination without abnormal findings: Secondary | ICD-10-CM | POA: Diagnosis not present

## 2023-10-27 NOTE — Patient Instructions (Addendum)
 Keep up the good exercise  Keep wearing sun protecton   I recommend starting mammograms at 39  Call us  around your birthday - we will put an order in   Eat a healthy balanced diet    Labs today   Pap done today

## 2023-10-27 NOTE — Assessment & Plan Note (Signed)
 Labs today for fatigue (wellness)   If normal pt was interested in more thyroid  labs

## 2023-10-27 NOTE — Assessment & Plan Note (Signed)
 Reviewed health habits including diet and exercise and skin cancer prevention Reviewed appropriate screening tests for age  Also reviewed health mt list, fam hx and immunization status , as well as social and family history   See HPI Labs reviewed and ordered Health Maintenance  Topic Date Due   Pap with HPV screening  03/12/2020   COVID-19 Vaccine (1) 11/11/2024*   Flu Shot  12/17/2023   DTaP/Tdap/Td vaccine (3 - Td or Tdap) 03/18/2028   HPV Vaccine  Completed   Hepatitis C Screening  Completed   HIV Screening  Completed   Meningitis B Vaccine  Aged Out  *Topic was postponed. The date shown is not the original due date.    Noted some fatigue  Pap and gyn exam today  Will be due for first screening mammo in feb  Discussed fall prevention, supplements and exercise for bone density  Wellness labs today  Discussed skin cancer prevention/sun protection  PHQ 5 - due fo fatigue

## 2023-10-27 NOTE — Assessment & Plan Note (Signed)
 Routine exam unremarkable  No need for contraception (partner had vasectomy)  Periods are regular Pap pending with hpv screen

## 2023-10-27 NOTE — Progress Notes (Signed)
 Subjective:    Patient ID: Rita Salas, female    DOB: [birth year], 38 y.o.   MRN: [id number]  HPI  Here for health maintenance exam and to review chronic medical problems   Wt Readings from Last 3 Encounters:  10/27/23 174 lb 6 oz (79.1 kg)  03/31/22 183 lb 3.2 oz (83.1 kg)  11/07/21 183 lb (83 kg)   28.14 kg/m  Vitals:   10/27/23 1528  BP: 126/68  Pulse: 81  Temp: 98.4 F (36.9 C)  SpO2: 98%    Immunization History  Administered Date(s) Administered   Hpv-Unspecified 09/12/2009, 11/12/2009, 03/18/2010   Influenza,inj,Quad PF,6+ Mos 02/14/2013, 02/19/2014, 02/25/2015, 03/02/2016, 03/12/2017, 03/18/2018, 03/24/2019   Td 12/05/2007   Tdap 03/18/2018    Health Maintenance Due  Topic Date Due   Cervical Cancer Screening (HPV/Pap Cotest)  03/12/2020   Feeling good   Lost some weight Busy  Making better food choices   Pap 02/2017    Mammogram- not yet, turns 39 in February  Self breast exam- no lumps   Gyn health-due for pap Last 2018  Menses- short , as short as 3 days  Not very heavy  Started having some cramps in the past year   Contraception  Partner had vasectomy    Bone health   Falls- none  Fractures-none  Supplements -vitamin D 1000 international units   Exercise  Walking in a weighted vest Uses kettle bells   Dermatology -does not go  Wants to start screening  Tans easily  Does wear sunscreen      Mood    10/27/2023    3:31 PM 09/27/2020    3:01 PM 03/24/2019    3:47 PM 03/18/2018    3:41 PM 03/12/2017    4:11 PM  Depression screen PHQ 2/9  Decreased Interest 0 0 0 0 0  Down, Depressed, Hopeless 0 2 0 1 1  PHQ - 2 Score 0 2 0 1 1  Altered sleeping 2 0 0  0  Tired, decreased energy 3 2 0  1  Change in appetite 0 0 0  0  Feeling bad or failure about yourself  0 0 0  0  Trouble concentrating 0 1 2  3   Moving slowly or fidgety/restless 0 0 0  0  Suicidal thoughts 0 0 0  0  PHQ-9 Score 5 5 2  5   Difficult doing  work/chores Not difficult at all  Not difficult at all     Some fatigue Not a great sleeper  Thinks mood is fine   Interested in thyroid  labs   Per earlier message: I'd like to get my thyroid  checked. I'm seeing someone about getting my hormones checked. When I talked with her, I told her I'm tired all the time, so she recommended getting labs done for my thyroid . She said to request: 1. TSH 2. T3 3. T4 4. 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   Allergic rhinitis 11/10/2006   Past Medical History:  Diagnosis Date   No pertinent past medical history  Past Surgical History:  Procedure Laterality Date   PILONIDAL CYST EXCISION     Social History   Tobacco Use   Smoking status: Former   Smokeless tobacco: Never  Vaping Use   Vaping status: Never Used  Substance Use Topics   Alcohol use: Yes    Alcohol/week: 0.0 standard drinks of alcohol    Comment: occassionally   Drug use: No   Family History  Problem Relation Age of Onset   Breast cancer Paternal Grandmother 62   Hypertension Mother    Hypertension Father    Allergies  Allergen Reactions   Amoxicillin Other (See Comments)    Other reaction(s): rash    Current Outpatient Medications on File Prior to Visit  Medication Sig Dispense Refill   BIOTIN PO Take 1 capsule by mouth daily. With collagen     cholecalciferol (VITAMIN D3) 25 MCG (1000 UNIT) tablet Take 1,000 Units by mouth daily.     MAGNESIUM PO Take 1 capsule by mouth at bedtime.     No current facility-administered medications on  file prior to visit.    Review of Systems  Constitutional:  Positive for fatigue. Negative for activity change, appetite change, fever and unexpected weight change.  HENT:  Negative for congestion, ear pain, rhinorrhea, sinus pressure and sore throat.   Eyes:  Negative for pain, redness and visual disturbance.  Respiratory:  Negative for cough, shortness of breath and wheezing.   Cardiovascular:  Negative for chest pain and palpitations.  Gastrointestinal:  Negative for abdominal pain, blood in stool, constipation and diarrhea.  Endocrine: Negative for polydipsia and polyuria.  Genitourinary:  Negative for dysuria, frequency and urgency.  Musculoskeletal:  Negative for arthralgias, back pain and myalgias.  Skin:  Negative for pallor and rash.  Allergic/Immunologic: Negative for environmental allergies.  Neurological:  Negative for dizziness, syncope and headaches.  Hematological:  Negative for adenopathy. Does not bruise/bleed easily.  Psychiatric/Behavioral:  Negative for decreased concentration and dysphoric mood. The patient is not nervous/anxious.        Objective:   Physical Exam Constitutional:      General: She is not in acute distress.    Appearance: Normal appearance. She is well-developed and normal weight. She is not ill-appearing or diaphoretic.  HENT:     Head: Normocephalic and atraumatic.     Right Ear: Tympanic membrane, ear canal and external ear normal.     Left Ear: Tympanic membrane, ear canal and external ear normal.     Nose: Nose normal. No congestion.     Mouth/Throat:     Mouth: Mucous membranes are moist.     Pharynx: Oropharynx is clear. No posterior oropharyngeal erythema.  Eyes:     General: No scleral icterus.    Extraocular Movements: Extraocular movements intact.     Conjunctiva/sclera: Conjunctivae normal.     Pupils: Pupils are equal, round, and reactive to light.  Neck:     Thyroid : No thyromegaly.     Vascular: No carotid bruit or JVD.   Cardiovascular:     Rate and Rhythm: Normal rate and regular rhythm.     Pulses: Normal pulses.     Heart sounds: Normal heart sounds.     No gallop.  Pulmonary:     Effort: Pulmonary effort is normal. No respiratory distress.     Breath sounds: Normal breath sounds. No wheezing.     Comments: Good air exch Chest:     Chest wall: No tenderness.  Abdominal:  General: Bowel sounds are normal. There is no distension or abdominal bruit.     Palpations: Abdomen is soft. There is no mass.     Tenderness: There is no abdominal tenderness.     Hernia: No hernia is present.  Genitourinary:    Comments: Breast exam: No mass, nodules, thickening, tenderness, bulging, retraction, inflamation, nipple discharge or skin changes noted.  No axillary or clavicular LA.                   Anus appears normal w/o hemorrhoids or masses       External genitalia : nl appearance and hair distribution/no lesions       Urethral meatus : nl size, no lesions or prolapse       Urethra: no masses, tenderness or scarring      Bladder : no masses or tenderness       Vagina: nl general appearance, mild clear discharge , no lesions, no significant cystocele  or rectocele       Cervix: no lesions/ discharge or friability      Uterus: nl size, contour, position, and mobility (not fixed) , non tender      Adnexa : no masses, tenderness, enlargement or nodularity          Musculoskeletal:        General: No tenderness. Normal range of motion.     Cervical back: Normal range of motion and neck supple. No rigidity. No muscular tenderness.     Right lower leg: No edema.     Left lower leg: No edema.     Comments: No kyphosis   Lymphadenopathy:     Cervical: No cervical adenopathy.  Skin:    General: Skin is warm and dry.     Coloration: Skin is not pale.     Findings: No erythema or rash.     Comments: Tanned Few lentigines   Neurological:     Mental Status: She is alert. Mental status is at baseline.      Cranial Nerves: No cranial nerve deficit.     Motor: No abnormal muscle tone.     Coordination: Coordination normal.     Gait: Gait normal.     Deep Tendon Reflexes: Reflexes are normal and symmetric. Reflexes normal.  Psychiatric:        Mood and Affect: Mood normal.        Cognition and Memory: Cognition and memory normal.           Assessment & Plan:   Problem List Items Addressed This Visit       Other   Routine general medical examination at a health care facility - Primary   Reviewed health habits including diet and exercise and skin cancer prevention Reviewed appropriate screening tests for age  Also reviewed health mt list, fam hx and immunization status , as well as social and family history   See HPI Labs reviewed and ordered Health Maintenance  Topic Date Due   Pap with HPV screening  03/12/2020   COVID-19 Vaccine (1) 11/11/2024*   Flu Shot  12/17/2023   DTaP/Tdap/Td vaccine (3 - Td or Tdap) 03/18/2028   HPV Vaccine  Completed   Hepatitis C Screening  Completed   HIV Screening  Completed   Meningitis B Vaccine  Aged Out  *Topic was postponed. The date shown is not the original due date.    Noted some fatigue  Pap and gyn exam today  Will be due  for first screening mammo in feb  Discussed fall prevention, supplements and exercise for bone density  Wellness labs today  Discussed skin cancer prevention/sun protection  PHQ 5 - due fo fatigue       Relevant Orders   CBC with Differential/Platelet   Comprehensive metabolic panel with GFR   Lipid Panel   TSH   Fatigue   Labs today for fatigue (wellness)   If normal pt was interested in more thyroid  labs       Encounter for routine gynecological examination   Routine exam unremarkable  No need for contraception (partner had vasectomy)  Periods are regular Pap pending with hpv screen       Relevant Orders   Cytology - PAP([place])   Contraception management   Partner had vasectomy

## 2023-10-27 NOTE — Assessment & Plan Note (Signed)
 Partner had vasectomy

## 2023-10-28 ENCOUNTER — Ambulatory Visit: Payer: Self-pay | Admitting: Family Medicine

## 2023-10-28 LAB — CBC WITH DIFFERENTIAL/PLATELET
Absolute Lymphocytes: 1970 {cells}/uL (ref 850–3900)
Absolute Monocytes: 782 {cells}/uL (ref 200–950)
Basophils Absolute: 20 {cells}/uL (ref 0–200)
Basophils Relative: 0.2 %
Eosinophils Absolute: 30 {cells}/uL (ref 15–500)
Eosinophils Relative: 0.3 %
HCT: 41.8 % (ref 35.0–45.0)
Hemoglobin: 13.5 g/dL (ref 11.7–15.5)
MCH: 29.1 pg (ref 27.0–33.0)
MCHC: 32.3 g/dL (ref 32.0–36.0)
MCV: 90.1 fL (ref 80.0–100.0)
MPV: 10.9 fL (ref 7.5–12.5)
Monocytes Relative: 7.9 %
Neutro Abs: 7098 {cells}/uL (ref 1500–7800)
Neutrophils Relative %: 71.7 %
Platelets: 293 10*3/uL (ref 140–400)
RBC: 4.64 10*6/uL (ref 3.80–5.10)
RDW: 13 % (ref 11.0–15.0)
Total Lymphocyte: 19.9 %
WBC: 9.9 10*3/uL (ref 3.8–10.8)

## 2023-10-28 LAB — COMPREHENSIVE METABOLIC PANEL WITH GFR
AG Ratio: 1.5 (calc) (ref 1.0–2.5)
ALT: 13 U/L (ref 6–29)
AST: 15 U/L (ref 10–30)
Albumin: 4.6 g/dL (ref 3.6–5.1)
Alkaline phosphatase (APISO): 89 U/L (ref 31–125)
BUN: 14 mg/dL (ref 7–25)
CO2: 25 mmol/L (ref 20–32)
Calcium: 10.9 mg/dL — ABNORMAL HIGH (ref 8.6–10.2)
Chloride: 102 mmol/L (ref 98–110)
Creat: 0.71 mg/dL (ref 0.50–0.97)
Globulin: 3.1 g/dL (ref 1.9–3.7)
Glucose, Bld: 98 mg/dL (ref 65–99)
Potassium: 4.1 mmol/L (ref 3.5–5.3)
Sodium: 136 mmol/L (ref 135–146)
Total Bilirubin: 0.4 mg/dL (ref 0.2–1.2)
Total Protein: 7.7 g/dL (ref 6.1–8.1)
eGFR: 111 mL/min/{1.73_m2} (ref >=60)

## 2023-10-28 LAB — TSH: TSH: 1.49 m[IU]/L

## 2023-10-28 LAB — LIPID PANEL
Cholesterol: 148 mg/dL (ref <200)
HDL: 62 mg/dL (ref >=50)
LDL Cholesterol (Calc): 68 mg/dL
Non-HDL Cholesterol (Calc): 86 mg/dL (ref <130)
Total CHOL/HDL Ratio: 2.4 (calc) (ref <5.0)
Triglycerides: 98 mg/dL (ref <150)

## 2023-11-01 LAB — CYTOLOGY - PAP
Adequacy: ABSENT
Comment: NEGATIVE
Diagnosis: NEGATIVE
High risk HPV: NEGATIVE

## 2024-02-29 ENCOUNTER — Ambulatory Visit
Admission: RE | Admit: 2024-02-29 | Discharge: 2024-02-29 | Disposition: A | Discharge status: Home / Self Care | Attending: Emergency Medicine | Admitting: Emergency Medicine

## 2024-02-29 VITALS — BP 128/78 | HR 82 | Temp 98.1°F | Resp 18

## 2024-02-29 DIAGNOSIS — R300 Dysuria: Secondary | ICD-10-CM | POA: Insufficient documentation

## 2024-02-29 LAB — POCT URINE DIPSTICK
Bilirubin, UA: NEGATIVE
Glucose, UA: NEGATIVE mg/dL
Ketones, POC UA: NEGATIVE mg/dL
Nitrite, UA: NEGATIVE
POC PROTEIN,UA: NEGATIVE
Spec Grav, UA: 1.01 (ref 1.010–1.025)
Urobilinogen, UA: 0.2 U/dL
pH, UA: 7 (ref 5.0–8.0)

## 2024-02-29 LAB — POCT URINE PREGNANCY: Preg Test, Ur: NEGATIVE

## 2024-02-29 MED ORDER — CEPHALEXIN 500 MG PO CAPS: 500.0000 mg | ORAL_CAPSULE | Freq: Three times a day (TID) | ORAL | 0 refills | Status: AC

## 2024-02-29 NOTE — Discharge Instructions (Signed)
 Take the antibiotic as directed.  The urine culture is pending.  We will call you if it shows the need to change or discontinue your antibiotic.    Follow up with your primary care provider if your symptoms are not improving.

## 2024-02-29 NOTE — ED Triage Notes (Signed)
 Patient to Urgent Care with complaints of dysuria.  Symptoms started Sunday.   Took cystex.

## 2024-02-29 NOTE — ED Provider Notes (Signed)
 [facility]    CSN: [id number] Arrival date & time: 02/29/24  1410      History   Chief Complaint Chief Complaint  Patient presents with   Dysuria    Uti - Entered by patient    HPI DYSTANY DUFFY is a 38 y.o. female.  Patient presents with 2-day history of dysuria.  No fever, chills, abdominal pain, hematuria, flank pain, vaginal discharge, pelvic pain.  She has been treating her dysuria with Cystex.  The history is provided by the patient and medical records.    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 Authorizing Provider  cephALEXin (KEFLEX) 500 MG capsule Take 1 capsule (500 mg total) by mouth 3 (three) times daily for 5 days. 02/29/24 03/05/24 Yes Corlis Burnard DEL, NP  BIOTIN PO Take 1 capsule by mouth daily. With collagen    [provider]  cholecalciferol (VITAMIN D3) 25 MCG (1000 UNIT) tablet Take 1,000 Units by mouth daily.    [provider]  MAGNESIUM PO Take 1 capsule by mouth at bedtime.    [provider]    Family  History Family History  Problem Relation Age of Onset   Breast cancer Paternal Grandmother 57   Hypertension Mother    Hypertension Father     Social History Social History   Tobacco Use   Smoking status: Former   Smokeless tobacco: Never  Vaping Use   Vaping status: Never Used  Substance Use Topics   Alcohol use: Yes    Alcohol/week: 0.0 standard drinks of alcohol    Comment: occassionally   Drug use: No     Allergies   Amoxicillin   Review of Systems Review of Systems  Constitutional:  Negative for chills and fever.  Gastrointestinal:  Negative for abdominal pain.  Genitourinary:  Positive for dysuria. Negative for flank pain, hematuria, pelvic pain and vaginal discharge.     Physical Exam Triage Vital Signs ED Triage Vitals  Encounter Vitals Group     BP      Girls Systolic BP Percentile      Girls Diastolic BP Percentile      Boys Systolic BP Percentile      Boys Diastolic BP Percentile      Pulse      Resp  Temp      Temp src      SpO2      Weight      Height      Head Circumference      Peak Flow      Pain Score      Pain Loc      Pain Education      Exclude from Growth Chart    No data found.  Updated Vital Signs BP 128/78   Pulse 82   Temp 98.1 F (36.7 C)   Resp 18   LMP 02/17/2024   SpO2 98%   Visual Acuity Right Eye Distance:   Left Eye Distance:   Bilateral Distance:    Right Eye Near:   Left Eye Near:    Bilateral Near:     Physical Exam Constitutional:      General: She is not in acute distress. HENT:     Mouth/Throat:     Mouth: Mucous membranes are moist.  Cardiovascular:     Rate and Rhythm: Normal rate.  Pulmonary:     Effort: Pulmonary effort is normal. No respiratory distress.  Abdominal:     General: Bowel sounds are normal.     Palpations: Abdomen is soft.     Tenderness: There is no abdominal tenderness. There is no right CVA tenderness, left CVA tenderness, guarding or rebound.  Neurological:      Mental Status: She is alert.      UC Treatments / Results  Labs (all labs ordered are listed, but only abnormal results are displayed) Labs Reviewed  POCT URINE DIPSTICK - Abnormal; Notable for the following components:      Result Value   Color, UA light yellow (*)    Blood, UA trace-intact (*)    Leukocytes, UA Small (1+) (*)    All other components within normal limits  URINE CULTURE  POCT URINE PREGNANCY    EKG   Radiology No results found.  Procedures Procedures (including critical care time)  Medications Ordered in UC Medications - No data to display  Initial Impression / Assessment and Plan / UC Course  I have reviewed the triage vital signs and the nursing notes.  Pertinent labs & imaging results that were available during my care of the patient were reviewed by me and considered in my medical decision making (see chart for details).    Dysuria.  Treating with Keflex. Urine culture pending. Discussed with patient that we will call her if the urine culture shows the need to change or discontinue the antibiotic. Instructed her to follow-up with her PCP if her symptoms are not improving. Patient agrees to plan of care.     Final Clinical Impressions(s) / UC Diagnoses   Final diagnoses:  Dysuria     Discharge Instructions      Take the antibiotic as directed.  The urine culture is pending.  We will call you if it shows the need to change or discontinue your antibiotic.    Follow-up with your primary care provider if your symptoms are not improving.      ED Prescriptions     Medication Sig Dispense Auth. Provider   cephALEXin (KEFLEX) 500 MG capsule Take 1 capsule (500 mg total) by mouth 3 (three) times daily for 5 days. 15 capsule Corlis Burnard DEL, NP      PDMP not reviewed this encounter.   Corlis Burnard DEL, NP 02/29/24 1447

## 2024-03-02 LAB — URINE CULTURE: Culture: >=100000 — AB

## 2024-03-03 ENCOUNTER — Ambulatory Visit (HOSPITAL_COMMUNITY): Payer: Self-pay

## 2024-03-03 MED ORDER — NITROFURANTOIN MONOHYD MACRO 100 MG PO CAPS: 100.0000 mg | ORAL_CAPSULE | Freq: Two times a day (BID) | ORAL | 0 refills | Status: AC

## 2024-03-07 ENCOUNTER — Ambulatory Visit: Admitting: Family Medicine
